# Patient Record
Sex: Male | Born: 1960 | Hispanic: No | Marital: Married | Smoking: Former smoker
Health system: Southern US, Community
[De-identification: ages and names within clinical notes are randomized; demographics above are authoritative.]

## PROBLEM LIST (undated history)

## (undated) ENCOUNTER — Emergency Department: Admission: EM | Payer: BC Managed Care – PPO | Source: Home / Self Care

## (undated) DIAGNOSIS — B1920 Unspecified viral hepatitis C without hepatic coma: Secondary | ICD-10-CM

## (undated) DIAGNOSIS — T7840XA Allergy, unspecified, initial encounter: Secondary | ICD-10-CM

## (undated) HISTORY — DX: Unspecified viral hepatitis C without hepatic coma: B19.20

## (undated) HISTORY — DX: Allergy, unspecified, initial encounter: T78.40XA

---

## 2004-04-23 ENCOUNTER — Ambulatory Visit: Payer: Self-pay | Admitting: Internal Medicine

## 2004-05-13 ENCOUNTER — Ambulatory Visit: Payer: Self-pay | Admitting: Gastroenterology

## 2004-06-13 ENCOUNTER — Ambulatory Visit: Payer: Self-pay | Admitting: Gastroenterology

## 2004-06-27 ENCOUNTER — Ambulatory Visit: Payer: Self-pay | Admitting: Gastroenterology

## 2004-07-11 ENCOUNTER — Ambulatory Visit: Payer: Self-pay | Admitting: Gastroenterology

## 2004-07-25 ENCOUNTER — Ambulatory Visit: Payer: Self-pay | Admitting: Internal Medicine

## 2004-08-22 ENCOUNTER — Ambulatory Visit: Payer: Self-pay | Admitting: Gastroenterology

## 2004-09-26 ENCOUNTER — Ambulatory Visit: Payer: Self-pay | Admitting: Internal Medicine

## 2004-10-24 ENCOUNTER — Ambulatory Visit: Payer: Self-pay | Admitting: Internal Medicine

## 2004-11-21 ENCOUNTER — Ambulatory Visit: Payer: Self-pay | Admitting: Internal Medicine

## 2005-02-13 ENCOUNTER — Ambulatory Visit: Payer: Self-pay | Admitting: Gastroenterology

## 2005-05-08 ENCOUNTER — Ambulatory Visit: Payer: Self-pay | Admitting: Gastroenterology

## 2005-10-30 ENCOUNTER — Ambulatory Visit: Payer: Self-pay | Admitting: Gastroenterology

## 2014-08-10 ENCOUNTER — Telehealth: Payer: Self-pay | Admitting: *Deleted

## 2014-08-10 ENCOUNTER — Ambulatory Visit (INDEPENDENT_AMBULATORY_CARE_PROVIDER_SITE_OTHER): Payer: Worker's Compensation | Admitting: Emergency Medicine

## 2014-08-10 ENCOUNTER — Ambulatory Visit: Payer: Self-pay

## 2014-08-10 VITALS — BP 132/82 | HR 107 | Temp 98.2°F | Resp 18 | Ht 72.0 in | Wt 268.0 lb

## 2014-08-10 DIAGNOSIS — S6710XA Crushing injury of unspecified finger(s), initial encounter: Secondary | ICD-10-CM

## 2014-08-10 DIAGNOSIS — S61219A Laceration without foreign body of unspecified finger without damage to nail, initial encounter: Secondary | ICD-10-CM | POA: Diagnosis not present

## 2014-08-10 DIAGNOSIS — S62639A Displaced fracture of distal phalanx of unspecified finger, initial encounter for closed fracture: Secondary | ICD-10-CM | POA: Diagnosis not present

## 2014-08-10 MED ORDER — CEPHALEXIN 500 MG PO CAPS: 500.0000 mg | ORAL_CAPSULE | Freq: Four times a day (QID) | ORAL | Status: DC

## 2014-08-10 MED ORDER — HYDROCODONE-ACETAMINOPHEN 5-325 MG PO TABS: 1.0000 | ORAL_TABLET | Freq: Four times a day (QID) | ORAL | Status: DC | PRN

## 2014-08-10 MED ORDER — TRAMADOL HCL 50 MG PO TABS: 50.0000 mg | ORAL_TABLET | Freq: Three times a day (TID) | ORAL | Status: DC | PRN

## 2014-08-10 NOTE — Progress Notes (Addendum)
[facility] [address], [place] [id number] [phone]  Date:  08/10/2014   Name:  Lucas Morton   DOB:  03/07/1961   MRN:  [id number]  PCP:  No primary care provider on file.    Chief Complaint: No chief complaint on file.   History of Present Illness:  Lucas Morton is a 54 y.o. very pleasant male patient who presents with the following:  Patient caught his finger in a door at work and has a tip avulsion Not current on tetanus Has marked pain in fifth finger No improvement with over the counter medications or other home remedies. Denies other complaint or health concern today.   There are no active problems to display for this patient.   No past medical history on file.  No past surgical history on file.  History  Substance Use Topics  . Smoking status: Not on file  . Smokeless tobacco: Not on file  . Alcohol Use: Not on file    No family history on file.  Not on File  Medication list has been reviewed and updated.  No current outpatient prescriptions on file prior to visit.   No current facility-administered medications on file prior to visit.    Review of Systems:  As per HPI, otherwise negative.    Physical Examination: There were no vitals filed for this visit. There were no vitals filed for this visit. There is no height or weight on file to calculate BMI. Ideal Body Weight:     GEN: WDWN, NAD, Non-toxic, Alert & Oriented x 3 HEENT: Atraumatic, Normocephalic.  Ears and Nose: No external deformity. EXTR: No clubbing/cyanosis/edema NEURO: Normal gait.  PSYCH: Normally interactive. Conversant. Not depressed or anxious appearing.  Calm demeanor.  Right hand:  1.5 cm avulsion tip of fifth finger on ulnar aspect No FB.  NATI   Assessment and Plan: Finger pain Laceration finger TDAP Discussed wound care options to include referral to hand, FTSG, and closure  He elected closure  Signed,  Phillips OdorJeffery Tymier Lindholm,  MD   [facility] reading (PRIMARY) by  Dr. Dareen PianoAnderson.  Small tuft fracture.

## 2014-08-10 NOTE — Progress Notes (Signed)
Verbal consent obtained from patient.  Digital block anesthesia with 5cc 1% lido without epi.  Wound scrubbed with soap and water and rinsed.  Wound closed with #8 5-0 ethilon simple interrupted sutures.  Wound cleansed and dressed.

## 2014-08-10 NOTE — Telephone Encounter (Signed)
Called pt and spoke with wife.  Advised her to tell pt to call the office back regarding his visit today.  Pt needs to come back to office and sign the back of the drug test consent form.

## 2014-08-10 NOTE — Patient Instructions (Addendum)
Laceration Care, Adult A laceration is a cut or lesion that goes through all layers of the skin and into the tissue just beneath the skin. TREATMENT  Some lacerations may not require closure. Some lacerations may not be able to be closed due to an increased risk of infection. It is important to see your caregiver as soon as possible after an injury to minimize the risk of infection and maximize the opportunity for successful closure. If closure is appropriate, pain medicines may be given, if needed. The wound will be cleaned to help prevent infection. Your caregiver will use stitches (sutures), staples, wound glue (adhesive), or skin adhesive strips to repair the laceration. These tools bring the skin edges together to allow for faster healing and a better cosmetic outcome. However, all wounds will heal with a scar. Once the wound has healed, scarring can be minimized by covering the wound with sunscreen during the day for 1 full year. HOME CARE INSTRUCTIONS  For sutures or staples:  Keep the wound clean and dry.  If you were given a bandage (dressing), you should change it at least once a day. Also, change the dressing if it becomes wet or dirty, or as directed by your caregiver.  Wash the wound with soap and water 2 times a day. Rinse the wound off with water to remove all soap. Pat the wound dry with a clean towel.  After cleaning, apply a thin layer of the antibiotic ointment as recommended by your caregiver. This will help prevent infection and keep the dressing from sticking.  You may shower as usual after the first 24 hours. Do not soak the wound in water until the sutures are removed.  Only take over-the-counter or prescription medicines for pain, discomfort, or fever as directed by your caregiver.  Get your sutures or staples removed as directed by your caregiver. For skin adhesive strips:  Keep the wound clean and dry.  Do not get the skin adhesive strips wet. You may bathe  carefully, using caution to keep the wound dry.  If the wound gets wet, pat it dry with a clean towel.  Skin adhesive strips will fall off on their own. You may trim the strips as the wound heals. Do not remove skin adhesive strips that are still stuck to the wound. They will fall off in time. For wound adhesive:  You may briefly wet your wound in the shower or bath. Do not soak or scrub the wound. Do not swim. Avoid periods of heavy perspiration until the skin adhesive has fallen off on its own. After showering or bathing, gently pat the wound dry with a clean towel.  Do not apply liquid medicine, cream medicine, or ointment medicine to your wound while the skin adhesive is in place. This may loosen the film before your wound is healed.  If a dressing is placed over the wound, be careful not to apply tape directly over the skin adhesive. This may cause the adhesive to be pulled off before the wound is healed.  Avoid prolonged exposure to sunlight or tanning lamps while the skin adhesive is in place. Exposure to ultraviolet light in the first year will darken the scar.  The skin adhesive will usually remain in place for 5 to 10 days, then naturally fall off the skin. Do not pick at the adhesive film. You may need a tetanus shot if:  You cannot remember when you had your last tetanus shot.  You have never had a tetanus  shot. If you get a tetanus shot, your arm may swell, get red, and feel warm to the touch. This is common and not a problem. If you need a tetanus shot and you choose not to have one, there is a rare chance of getting tetanus. Sickness from tetanus can be serious. SEEK MEDICAL CARE IF:   You have redness, swelling, or increasing pain in the wound.  You see a red line that goes away from the wound.  You have yellowish-white fluid (pus) coming from the wound.  You have a fever.  You notice a bad smell coming from the wound or dressing.  Your wound breaks open before or  after sutures have been removed.  You notice something coming out of the wound such as wood or glass.  Your wound is on your hand or foot and you cannot move a finger or toe. SEEK IMMEDIATE MEDICAL CARE IF:   Your pain is not controlled with prescribed medicine.  You have severe swelling around the wound causing pain and numbness or a change in color in your arm, hand, leg, or foot.  Your wound splits open and starts bleeding.  You have worsening numbness, weakness, or loss of function of any joint around or beyond the wound.  You develop painful lumps near the wound or on the skin anywhere on your body. MAKE SURE YOU:   Understand these instructions.  Will watch your condition.  Will get help right away if you are not doing well or get worse. Document Released: 05/12/2005 Document Revised: 08/04/2011 Document Reviewed: 11/05/2010 [facility] Patient Information 2015 [place], [place]. This information is not intended to replace advice given to you by your health care provider. Make sure you discuss any questions you have with your health care provider.   Vaccine (Tetanus, Diphtheria, Pertussis): What You Need to Know 1. Why get vaccinated? Tetanus, diphtheria and pertussis can be very serious diseases, even for adolescents and adults. Tdap vaccine can protect [place] from these diseases. TETANUS (Lockjaw) causes painful muscle tightening and stiffness, usually all over the body.  It can lead to tightening of muscles in the head and neck so you can't open your mouth, swallow, or sometimes even breathe. Tetanus kills about 1 out of 5 people who are infected. DIPHTHERIA can cause a thick coating to form in the back of the throat.  It can lead to breathing problems, paralysis, heart failure, and death. PERTUSSIS (Whooping Cough) causes severe coughing spells, which can cause difficulty breathing, vomiting and disturbed sleep.  It can also lead to weight loss, incontinence, and rib fractures.  Up to 2 in 100 adolescents and 5 in 100 adults with pertussis are hospitalized or have complications, which could include pneumonia or death. These diseases are caused by bacteria. Diphtheria and pertussis are spread from person to person through coughing or sneezing. Tetanus enters the body through cuts, scratches, or wounds. Before vaccines, the [place] saw as many as 200,000 cases a year of diphtheria and pertussis, and hundreds of cases of tetanus. Since vaccination began, tetanus and diphtheria have dropped by about 99% and pertussis by about 80%. 2. Tdap vaccine Tdap vaccine can protect adolescents and adults from tetanus, diphtheria, and pertussis. One dose of Tdap is routinely given at age 34 or 27. People who did not get Tdap at that age should get it as soon as possible. Tdap is especially important for health care professionals and anyone having close contact with a baby younger than 12 months. Pregnant  women should get a dose of Tdap during every pregnancy, to protect the newborn from pertussis. Infants are most at risk for severe, life-threatening complications from pertussis. A similar vaccine, called Td, protects from tetanus and diphtheria, but not pertussis. A Td booster should be given every 10 years. Tdap may be given as one of these boosters if you have not already gotten a dose. Tdap may also be given after a severe cut or burn to prevent tetanus infection. Your doctor can give you more information. Tdap may safely be given at the same time as other vaccines. 3. Some people should not get this vaccine  If you ever had a life-threatening allergic reaction after a dose of any tetanus, diphtheria, or pertussis containing vaccine, OR if you have a severe allergy to any part of this vaccine, you should not get Tdap. Tell your doctor if you have any severe allergies.  If you had a coma, or long or multiple seizures within 7 days after a childhood dose of DTP or DTaP, you should not  get Tdap, unless a cause other than the vaccine was found. You can still get Td.  Talk to your doctor if you:  have epilepsy or another nervous system problem,  had severe pain or swelling after any vaccine containing diphtheria, tetanus or pertussis,  ever had Guillain-Barr Syndrome (GBS),  aren't feeling well on the day the shot is scheduled. 4. Risks of a vaccine reaction With any medicine, including vaccines, there is a chance of side effects. These are usually mild and go away on their own, but serious reactions are also possible. Brief fainting spells can follow a vaccination, leading to injuries from falling. Sitting or lying down for about 15 minutes can help prevent these. Tell your doctor if you feel dizzy or light-headed, or have vision changes or ringing in the ears. Mild problems following Tdap (Did not interfere with activities)  Pain where the shot was given (about 3 in 4 adolescents or 2 in 3 adults)  Redness or swelling where the shot was given (about 1 person in 5)  Mild fever of at least 100.28F (up to about 1 in 25 adolescents or 1 in 100 adults)  Headache (about 3 or 4 people in 10)  Tiredness (about 1 person in 3 or 4)  Nausea, vomiting, diarrhea, stomach ache (up to 1 in 4 adolescents or 1 in 10 adults)  Chills, body aches, sore joints, rash, swollen glands (uncommon) Moderate problems following Tdap (Interfered with activities, but did not require medical attention)  Pain where the shot was given (about 1 in 5 adolescents or 1 in 100 adults)  Redness or swelling where the shot was given (up to about 1 in 16 adolescents or 1 in 25 adults)  Fever over 102F (about 1 in 100 adolescents or 1 in 250 adults)  Headache (about 3 in 20 adolescents or 1 in 10 adults)  Nausea, vomiting, diarrhea, stomach ache (up to 1 or 3 people in 100)  Swelling of the entire arm where the shot was given (up to about 3 in 100). Severe problems following Tdap (Unable to  perform usual activities; required medical attention)  Swelling, severe pain, bleeding and redness in the arm where the shot was given (rare). A severe allergic reaction could occur after any vaccine (estimated less than 1 in a million doses). 5. What if there is a serious reaction? What should I look for?  Look for anything that concerns you, such as signs  of a severe allergic reaction, very high fever, or behavior changes. Signs of a severe allergic reaction can include hives, swelling of the face and throat, difficulty breathing, a fast heartbeat, dizziness, and weakness. These would start a few minutes to a few hours after the vaccination. What should I do?  If you think it is a severe allergic reaction or other emergency that can't wait, call 9-1-1 or get the person to the nearest hospital. Otherwise, call your doctor.  Afterward, the reaction should be reported to the "Vaccine Adverse Event Reporting System" (VAERS). Your doctor might file this report, or you can do it yourself through the VAERS web site at www.vaers.LAgents.no, or by calling 1-845-481-6302. VAERS is only for reporting reactions. They do not give medical advice.  6. The National Vaccine Injury Compensation Program The [employer] Vaccine Injury Compensation Program (VICP) is a federal program that was created to compensate people who may have been injured by certain vaccines. Persons who believe they may have been injured by a vaccine can learn about the program and about filing a claim by calling 1-(225) 534-0191 or visiting the VICP website at SpiritualWord.at. 7. How can I learn more?  Ask your doctor.  Call your local or state health department.  Contact the Centers for Disease Control and Prevention (CDC):  Call (870)433-5817 or visit CDC's website at PicCapture.uy. CDC Tdap Vaccine VIS (10/02/11) Document Released: 11/11/2011 Document Revised: 09/26/2013 Document Reviewed: 08/24/2013 [employer]  Patient Information 2015 [place], [place]. This information is not intended to replace advice given to you by your health care provider. Make sure you discuss any questions you have with your health care provider.

## 2014-08-17 ENCOUNTER — Ambulatory Visit (INDEPENDENT_AMBULATORY_CARE_PROVIDER_SITE_OTHER): Payer: Worker's Compensation | Admitting: Physician Assistant

## 2014-08-17 VITALS — BP 138/80 | HR 77 | Temp 98.3°F | Resp 18 | Ht 71.0 in | Wt 267.6 lb

## 2014-08-17 DIAGNOSIS — S6710XD Crushing injury of unspecified finger(s), subsequent encounter: Secondary | ICD-10-CM | POA: Diagnosis not present

## 2014-08-17 DIAGNOSIS — S62639D Displaced fracture of distal phalanx of unspecified finger, subsequent encounter for fracture with routine healing: Secondary | ICD-10-CM

## 2014-08-17 DIAGNOSIS — Z5189 Encounter for other specified aftercare: Secondary | ICD-10-CM

## 2014-08-17 NOTE — Patient Instructions (Signed)
Your wound is healing nicely. We removed all the sutures today.  Please continue to finish your antibiotic course since you did sustain a "tuft" fracture.  Please soak the wound in water/hydrogen peroxide mix once daily for the next couple days.  Please let us know if you start having fever, chills, expanding redness around the wound, or pus draining from the wound.  Since you did fracture the tip of the finger please wear the splint when you are at work and doing things.  Please come back to see us in one week for further check.  You can return to work today with precautions, no lifting, pulling, pushing, grasping with right hand until we recheck the wound in one week. This is to protect the fracture.

## 2014-08-17 NOTE — Progress Notes (Signed)
  Medical screening examination/treatment/procedure(s) were performed by non-physician practitioner and as supervising physician I was immediately available for consultation/collaboration.     

## 2014-08-17 NOTE — Progress Notes (Signed)
   Subjective:    Patient ID: Lucas Morton, male    DOB: 06/02/1960, 54 y.o.   MRN: [id number]  Chief Complaint  Patient presents with  . Follow-up    wound care   There are no active problems to display for this patient.  Prior to Admission medications   Medication Sig Start Date End Date Taking? Authorizing Provider  aspirin 325 MG tablet Take 325 mg by mouth daily.   Yes Historical Provider, MD  cephALEXin (KEFLEX) 500 MG capsule Take 1 capsule (500 mg total) by mouth 4 (four) times daily. 08/10/14  Yes Huey Bienenstockodd M Jhene Westmoreland, PA  Multiple Vitamin (MULTIVITAMIN) tablet Take 1 tablet by mouth daily.   Yes Historical Provider, MD  traMADol (ULTRAM) 50 MG tablet Take 1 tablet (50 mg total) by mouth every 8 (eight) hours as needed. 08/10/14  Yes Huey Bienenstockodd M Malicia Blasdel, PA   Medications, allergies, past medical history, surgical history, family history, social history and problem list reviewed and updated.  HPI  [age] yom returns for wound check.   Denies purulence from the area. States it is healing well. Minimal pain, has taken the tramadol several times which has helped. Denies fever, chills. Has been keeping area protected while at work. Has good range of motion in finger. Has been taking the keflex qid. Has few more days left.   Review of Systems See HPI.     Objective:   Physical Exam  Constitutional: He is oriented to person, place, and time. He appears well-developed and well-nourished.  Non-toxic appearance. He does not have a sickly appearance. He does not appear ill. No distress.  BP 138/80 mmHg  Pulse 77  Temp(Src) 98.3 F (36.8 C) (Oral)  Resp 18  Ht 5\' 11"  (1.803 m)  Wt 267 lb 9.6 oz (121.383 kg)  BMI 37.34 kg/m2  SpO2 96%   Neurological: He is alert and oriented to person, place, and time.  Skin:  Stitches in place on finger. Mild surrounding erythema just proximal to wound. No purulence. Normal rom. Normal strength. Normal sensation. Normal cap refill.   Psychiatric: He has a  normal mood and affect. His speech is normal.      Assessment & Plan:   [age] yom returns for wound check.   Visit for wound check --wound healing well --stitches removed, no signs of infection at this time --soak in water/H2O2 next couple days --rtc with fever, chills, expanding redness, pus --good rom --keeping dressing in place when out of house, when home resting remove dressing  Closed fracture of tuft of distal phalanx of finger, with routine healing, subsequent encounter --finish abx course --DIP splint applied for protection, wear while at work and active --return to work with precautions --> no right hand use for gripping, lifting, pushing, pulling --rtc one week for recheck  Donnajean Lopesodd M. Khaliah Barnick, PA-C Physician Assistant-Certified [facility]  08/17/2014 1:39 PM

## 2014-08-24 ENCOUNTER — Encounter: Payer: Self-pay | Admitting: Physician Assistant

## 2014-08-24 ENCOUNTER — Ambulatory Visit (INDEPENDENT_AMBULATORY_CARE_PROVIDER_SITE_OTHER): Payer: Worker's Compensation | Admitting: Physician Assistant

## 2014-08-24 VITALS — BP 134/82 | HR 86 | Temp 98.6°F | Resp 16 | Ht 71.0 in | Wt 266.0 lb

## 2014-08-24 DIAGNOSIS — Z5189 Encounter for other specified aftercare: Secondary | ICD-10-CM

## 2014-08-24 DIAGNOSIS — S62639D Displaced fracture of distal phalanx of unspecified finger, subsequent encounter for fracture with routine healing: Secondary | ICD-10-CM

## 2014-08-24 NOTE — Patient Instructions (Signed)
Your wound is healing well.  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 This should be for the next 2 weeks from today. This is to continue to protect the Tuft fracture at the tip of the finger.  Afterward you are free to perform activities without the splint.  Please come back to see us if you are having redness around the area, pus draining, or fevers or chills.  No further follow up with us needed.

## 2014-08-24 NOTE — Progress Notes (Signed)
   Subjective:    Patient ID: Lucas Morton, male    DOB: 02/05/1961, 54 y.o.   MRN: [id number]  Chief Complaint  Patient presents with  . Follow-up   There are no active problems to display for this patient.  Prior to Admission medications   Medication Sig Start Date End Date Taking? Authorizing Provider  aspirin 325 MG tablet Take 325 mg by mouth daily.   Yes Historical Provider, MD  Multiple Vitamin (MULTIVITAMIN) tablet Take 1 tablet by mouth daily.   Yes Historical Provider, MD  traMADol (ULTRAM) 50 MG tablet Take 1 tablet (50 mg total) by mouth every 8 (eight) hours as needed. 08/10/14  Yes Huey Bienenstockodd M Ahnya Akre, PA   Medications, allergies, past medical history, surgical history, family history, social history and problem list reviewed and updated.  HPI  [age] yom returns for wound care.   Site is healing well. Erythema has regressed. Denies fevers, chills. Has been wearing fold over splint while working, doing light tasks with the right hand with no complaints. He has finished his abx course.   Review of Systems No HPI.     Objective:   Physical Exam  Constitutional: He appears well-developed and well-nourished.  Non-toxic appearance. He does not have a sickly appearance. He does not appear ill. No distress.  BP 134/82 mmHg  Pulse 86  Temp(Src) 98.6 F (37 C) (Oral)  Resp 16  Ht 5\' 11"  (1.803 m)  Wt 266 lb (120.657 kg)  BMI 37.12 kg/m2  SpO2 98%   Musculoskeletal:  Right pinky injury healing well. Sutures were removed last week. Incision has held intact. No purulence. Very mild surrounding erythema just distal to wound, this is improved from last week. Full rom with pinky. Normal sensation. Normal strength. Normal cap refill.       Assessment & Plan:   [age] yom returns for wound care.   Closed fracture of tuft of distal phalanx of finger, with routine healing, subsequent encounter Visit for wound check --healing well --ok to return to work with fold over splint use for  next 2 weeks, no restrictions after that time period --no further f/u with us --rtc with pus, expanding redness, fevers, chills  Donnajean Lopesodd M. Raymund Manrique, PA-C Physician Assistant-Certified [facility]  08/24/2014 1:08 PM

## 2018-05-26 DIAGNOSIS — K7290 Hepatic failure, unspecified without coma: Secondary | ICD-10-CM

## 2018-05-26 HISTORY — DX: Hepatic failure, unspecified without coma: K72.90

## 2019-05-27 DIAGNOSIS — R7303 Prediabetes: Secondary | ICD-10-CM

## 2019-05-27 HISTORY — DX: Prediabetes: R73.03

## 2019-09-09 ENCOUNTER — Ambulatory Visit: Payer: Self-pay | Admitting: Registered Nurse

## 2020-05-26 DIAGNOSIS — J189 Pneumonia, unspecified organism: Secondary | ICD-10-CM

## 2020-05-26 HISTORY — DX: Pneumonia, unspecified organism: J18.9

## 2020-07-09 ENCOUNTER — Ambulatory Visit
Admission: EM | Admit: 2020-07-09 | Discharge: 2020-07-09 | Disposition: A | Discharge status: Home / Self Care | Payer: BC Managed Care – PPO

## 2020-07-09 ENCOUNTER — Other Ambulatory Visit: Payer: Self-pay

## 2020-07-09 DIAGNOSIS — R5383 Other fatigue: Secondary | ICD-10-CM | POA: Diagnosis not present

## 2020-07-09 DIAGNOSIS — R251 Tremor, unspecified: Secondary | ICD-10-CM

## 2020-07-09 NOTE — Discharge Instructions (Signed)
I believe that you are still having symptoms from what was most likely COVID. Lets give this another week and see how it goes.  Rest, drink fluids, take your vitamins, go for a walk.  Come back next week if symptoms do not improve.

## 2020-07-09 NOTE — ED Triage Notes (Signed)
Patient presents to Urgent Care with complaints of fatigue, chills, and some tremors since 1/26. Pt states he tested negative for Covid.   Denies fever, abdominal pain, n/v, or diarrhea.

## 2020-07-09 NOTE — ED Provider Notes (Signed)
Renaldo Fiddler    CSN: [id number] Arrival date & time: 07/09/20  0804      History   Chief Complaint Chief Complaint  Patient presents with  . Fatigue    HPI Lucas Morton is a 60 y.o. male.   Pt is a 60 year old male that is otherwise healthy that presents with fatigue, tremors, anxiety. This has been for the past 2 weeks or so. Waxing and waning. Believes that he may have had Covid at the end of January. He had 3 -4 days of extreme fatigue, couldn't get out of bed, respiratory symptoms. Lost 20 pounds. After that his appetite returned and energy was better. Still has lingering sypmptoms and is concerned. He is eating, sleeping and drinking well at this time. Went to work today and they were concerned due to tremors and other symptoms. Has trouble focusing at times. Works on [employer].  Denies any headaches, dizziness, blurred vision, chest pain, shortness of breath, fevers.  Mildly tachycardic today but reports he is feeling pretty anxious. No alcohol, drug use.      History reviewed. No pertinent past medical history.  There are no problems to display for this patient.   History reviewed. No pertinent surgical history.     Home Medications    Prior to Admission medications   Medication Sig Start Date End Date Taking? Authorizing Provider  aspirin 325 MG tablet Take 325 mg by mouth daily.    [provider]  Multiple Vitamin (MULTIVITAMIN) tablet Take 1 tablet by mouth daily.    [provider]  traMADol (ULTRAM) 50 MG tablet Take 1 tablet (50 mg total) by mouth every 8 (eight) hours as needed. 08/10/14   Raelyn Ensign, PA    Family History History reviewed. No pertinent family history.  Social History Social History   Tobacco Use  . Smoking status: Never Smoker  . Smokeless tobacco: Never Used  Substance Use Topics  . Alcohol use: No    Alcohol/week: 0.0 standard drinks  . Drug use: No     Allergies   Tylenol  [acetaminophen]   Review of Systems Review of Systems   Physical Exam Triage Vital Signs ED Triage Vitals  Enc Vitals Group     BP --      Pulse Rate 07/09/20 0815 (!) 125     Resp 07/09/20 0815 18     Temp 07/09/20 0815 98.6 F (37 C)     Temp Source 07/09/20 0815 Oral     SpO2 07/09/20 0815 95 %     Weight --      Height --      Head Circumference --      Peak Flow --      Pain Score 07/09/20 0820 0     Pain Loc --      Pain Edu? --      Excl. in GC? --    No data found.  Updated Vital Signs BP (!) 155/92 (BP Location: Left Arm)   Pulse (!) 125   Temp 98.6 F (37 C) (Oral)   Resp 18   SpO2 95%   Visual Acuity Right Eye Distance:   Left Eye Distance:   Bilateral Distance:    Right Eye Near:   Left Eye Near:    Bilateral Near:     Physical Exam Constitutional:      General: He is not in acute distress.    Appearance: Normal appearance. He is not ill-appearing, toxic-appearing or  diaphoretic.  HENT:     Nose: Nose normal.  Eyes:     Conjunctiva/sclera: Conjunctivae normal.  Cardiovascular:     Rate and Rhythm: Tachycardia present.     Pulses: Normal pulses.     Heart sounds: Normal heart sounds.  Pulmonary:     Effort: Pulmonary effort is normal.     Breath sounds: Normal breath sounds.  Musculoskeletal:        General: Normal range of motion.  Skin:    General: Skin is warm and dry.  Neurological:     General: No focal deficit present.     Mental Status: He is alert.     Motor: Tremor present.  Psychiatric:        Mood and Affect: Mood normal.      UC Treatments / Results  Labs (all labs ordered are listed, but only abnormal results are displayed) Labs Reviewed - No data to display  EKG   Radiology No results found.  Procedures Procedures (including critical care time)  Medications Ordered in UC Medications - No data to display  Initial Impression / Assessment and Plan / UC Course  I have reviewed the triage vital signs and  the nursing notes.  Pertinent labs & imaging results that were available during my care of the patient were reviewed by me and considered in my medical decision making (see chart for details).     Fatigue and tremor  Unsure of cause at this time but could be post covid symptoms.  Based on pt hx may have had covid a few weeks ago. Feeling better but having residual tremor and fatigue. Pt very anxious.  Has been checking vitals at home which hear rate ranges in the 90s and oxygen has been 97%.  Mild tremor on exam today.  Otherwise no concerns on exam.  Heart rate around 98 but regular on auscultation.  Offered patient labs and work-up but patient would like to wait for another week or so to see if symptoms resolve or get better Recommended to continue staying hydrated, resting, eating healthy meals and drinking fluids Follow up as needed for continued or worsening symptoms  Final Clinical Impressions(s) / UC Diagnoses   Final diagnoses:  Fatigue, unspecified type  Tremor     Discharge Instructions     I believe that you are still having symptoms from what was most likely COVID. Lets give this another week and see how it goes.  Rest, drink fluids, take your vitamins, go for a walk.  Come back next week if symptoms do not improve.       ED Prescriptions    None     PDMP not reviewed this encounter.   Janace Aris, NP 07/09/20 1524

## 2020-07-16 ENCOUNTER — Ambulatory Visit
Admission: EM | Admit: 2020-07-16 | Discharge: 2020-07-16 | Disposition: A | Discharge status: Home / Self Care | Payer: BC Managed Care – PPO | Attending: Family Medicine | Admitting: Family Medicine

## 2020-07-16 ENCOUNTER — Other Ambulatory Visit: Payer: Self-pay

## 2020-07-16 ENCOUNTER — Ambulatory Visit (INDEPENDENT_AMBULATORY_CARE_PROVIDER_SITE_OTHER): Payer: BC Managed Care – PPO

## 2020-07-16 DIAGNOSIS — R5383 Other fatigue: Secondary | ICD-10-CM | POA: Diagnosis present

## 2020-07-16 DIAGNOSIS — J189 Pneumonia, unspecified organism: Secondary | ICD-10-CM | POA: Insufficient documentation

## 2020-07-16 MED ORDER — AMOXICILLIN-POT CLAVULANATE 875-125 MG PO TABS: 1.0000 | ORAL_TABLET | Freq: Two times a day (BID) | ORAL | 0 refills | Status: DC

## 2020-07-16 NOTE — ED Triage Notes (Signed)
Pt c/o fatigue with minimal exertion, weakness, new tremor to hands, accelerated heart rate for past week. Reports congestion that is improving.  Denies CP, SOB, extremity weakness, pain to back, jaw, arm, n/v, diaphoresis. S1S2 RRR

## 2020-07-16 NOTE — ED Provider Notes (Addendum)
Renaldo Fiddler    CSN: [id number] Arrival date & time: 07/16/20  0801      History   Chief Complaint Chief Complaint  Patient presents with  . Tachycardia  . Fatigue    HPI Jamarius Saha is a 60 y.o. male.   Pt is a 60 year old male that presents with continued fatigue, SOB, tremors, tachycardia. This has been for the past few weeks. Was seen here a week ago and did not want a work up at that time. We thought symptoms most likely from covid. He is here today with wanting a work up done. Symptoms have remained the same and not worsened.      History reviewed. No pertinent past medical history.  There are no problems to display for this patient.   History reviewed. No pertinent surgical history.     Home Medications    Prior to Admission medications   Medication Sig Start Date End Date Taking? Authorizing Provider  amoxicillin-clavulanate (AUGMENTIN) 875-125 MG tablet Take 1 tablet by mouth every 12 (twelve) hours. 07/16/20  Yes Jowana Thumma A, NP  aspirin 325 MG tablet Take 325 mg by mouth daily.   Yes [provider]  traMADol (ULTRAM) 50 MG tablet Take 1 tablet (50 mg total) by mouth every 8 (eight) hours as needed. 08/10/14  Yes McVeigh, Todd, PA  Multiple Vitamin (MULTIVITAMIN) tablet Take 1 tablet by mouth daily.    [provider]    Family History History reviewed. No pertinent family history.  Social History Social History   Tobacco Use  . Smoking status: Never Smoker  . Smokeless tobacco: Never Used  Substance Use Topics  . Alcohol use: No    Alcohol/week: 0.0 standard drinks  . Drug use: No     Allergies   Tylenol [acetaminophen]   Review of Systems Review of Systems   Physical Exam Triage Vital Signs ED Triage Vitals  Enc Vitals Group     BP 07/16/20 0813 (!) 158/98     Pulse Rate 07/16/20 0813 (!) 116     Resp 07/16/20 0813 18     Temp 07/16/20 0813 99 F (37.2 C)     Temp Source 07/16/20 0813 Oral      SpO2 07/16/20 0813 95 %     Weight --      Height --      Head Circumference --      Peak Flow --      Pain Score 07/16/20 0816 0     Pain Loc --      Pain Edu? --      Excl. in GC? --    No data found.  Updated Vital Signs BP (!) 158/98 (BP Location: Left Arm)   Pulse (!) 116   Temp 99 F (37.2 C) (Oral)   Resp 18   SpO2 95%   Visual Acuity Right Eye Distance:   Left Eye Distance:   Bilateral Distance:    Right Eye Near:   Left Eye Near:    Bilateral Near:     Physical Exam Vitals and nursing note reviewed.  Constitutional:      General: He is not in acute distress.    Appearance: Normal appearance. He is not ill-appearing, toxic-appearing or diaphoretic.  HENT:     Head: Normocephalic and atraumatic.  Eyes:     Conjunctiva/sclera: Conjunctivae normal.  Pulmonary:     Effort: Pulmonary effort is normal.  Musculoskeletal:  General: Normal range of motion.     Cervical back: Normal range of motion.  Skin:    General: Skin is warm and dry.  Neurological:     Mental Status: He is alert.  Psychiatric:        Mood and Affect: Mood normal.      UC Treatments / Results  Labs (all labs ordered are listed, but only abnormal results are displayed) Labs Reviewed  NOVEL CORONAVIRUS, NAA  CBC WITH DIFFERENTIAL/PLATELET  COMPREHENSIVE METABOLIC PANEL  TSH  POCT URINALYSIS DIP (MANUAL ENTRY)    EKG   Radiology DG Chest 2 View  Result Date: 07/16/2020 CLINICAL DATA:  Fatigue, shortness of breath. EXAM: CHEST - 2 VIEW COMPARISON:  None. FINDINGS: The heart size and mediastinal contours are within normal limits. No pneumothorax or pleural effusion is noted. Patchy airspace opacities are noted in both lungs consistent with multifocal pneumonia. The visualized skeletal structures are unremarkable. IMPRESSION: Multifocal pneumonia. Electronically Signed   By: Lupita Raider M.D.   On: 07/16/2020 08:55    Procedures Procedures (including critical care  time)  Medications Ordered in UC Medications - No data to display  Initial Impression / Assessment and Plan / UC Course  I have reviewed the triage vital signs and the nursing notes.  Pertinent labs & imaging results that were available during my care of the patient were reviewed by me and considered in my medical decision making (see chart for details).     Multifocal pneumonia This is likely the cause of his symptoms.  Most likely Covid pneumonia. His symptoms have been persistent over the past few weeks. Still having low grade temp.  EKG normal sinus rhythm and mild tachycardia. No concern for ACS.  I am opting to go ahead and cover with abx at this time incase secondary bacterial infection. Treating with Augmentin.  Recommended continue to rest, hydrate, eat healthy meals and take vitamins.  I am checking some basic labs.   Final Clinical Impressions(s) / UC Diagnoses   Final diagnoses:  Fatigue, unspecified type  Multifocal pneumonia     Discharge Instructions     Your x ray showed multifocal pneumonia which is likely from Covid.  Since its been a few weeks we can go ahead and trial a round of antibiotics to see if this helps in case its bacterial Make sure you rest, hydrate and take vitamins to boost the immune system.  I will call you with any abnormalities of your blood work.      ED Prescriptions    Medication Sig Dispense Auth. Provider   amoxicillin-clavulanate (AUGMENTIN) 875-125 MG tablet Take 1 tablet by mouth every 12 (twelve) hours. 14 tablet Nabeeha Badertscher A, NP     PDMP not reviewed this encounter.       Janace Aris, NP 07/16/20 [phone]

## 2020-07-16 NOTE — Discharge Instructions (Addendum)
Your x ray showed multifocal pneumonia which is likely from Covid.  Since its been a few weeks we can go ahead and trial a round of antibiotics to see if this helps in case its bacterial Make sure you rest, hydrate and take vitamins to boost the immune system.  I will call you with any abnormalities of your blood work.

## 2020-07-17 LAB — NOVEL CORONAVIRUS, NAA: SARS-CoV-2, NAA: NOT DETECTED

## 2020-07-17 LAB — COMPREHENSIVE METABOLIC PANEL
ALT: 35 IU/L (ref 0–44)
AST: 17 IU/L (ref 0–40)
Albumin/Globulin Ratio: 1.2 (ref 1.2–2.2)
Albumin: 4 g/dL (ref 3.8–4.8)
Alkaline Phosphatase: 82 IU/L (ref 44–121)
BUN/Creatinine Ratio: 18 (ref 10–24)
BUN: 13 mg/dL (ref 8–27)
Bilirubin Total: 0.4 mg/dL (ref 0.0–1.2)
CO2: 20 mmol/L (ref 20–29)
Calcium: 9.3 mg/dL (ref 8.6–10.2)
Chloride: 105 mmol/L (ref 96–106)
Creatinine, Ser: 0.72 mg/dL — ABNORMAL LOW (ref 0.76–1.27)
GFR calc Af Amer: 116 mL/min/{1.73_m2} (ref >59)
GFR calc non Af Amer: 101 mL/min/{1.73_m2} (ref >59)
Globulin, Total: 3.4 g/dL (ref 1.5–4.5)
Glucose: 115 mg/dL — ABNORMAL HIGH (ref 65–99)
Potassium: 3.9 mmol/L (ref 3.5–5.2)
Sodium: 142 mmol/L (ref 134–144)
Total Protein: 7.4 g/dL (ref 6.0–8.5)

## 2020-07-17 LAB — SARS-COV-2, NAA 2 DAY TAT

## 2020-07-17 LAB — CBC WITH DIFFERENTIAL/PLATELET
Basophils Absolute: 0 10*3/uL (ref 0.0–0.2)
Basos: 0 %
EOS (ABSOLUTE): 0.2 10*3/uL (ref 0.0–0.4)
Eos: 2 %
Hematocrit: 45.9 % (ref 37.5–51.0)
Hemoglobin: 15.7 g/dL (ref 13.0–17.7)
Immature Grans (Abs): 0 10*3/uL (ref 0.0–0.1)
Immature Granulocytes: 0 %
Lymphocytes Absolute: 1.8 10*3/uL (ref 0.7–3.1)
Lymphs: 20 %
MCH: 29.7 pg (ref 26.6–33.0)
MCHC: 34.2 g/dL (ref 31.5–35.7)
MCV: 87 fL (ref 79–97)
Monocytes Absolute: 1 10*3/uL — ABNORMAL HIGH (ref 0.1–0.9)
Monocytes: 11 %
Neutrophils Absolute: 5.9 10*3/uL (ref 1.4–7.0)
Neutrophils: 67 %
Platelets: 232 10*3/uL (ref 150–450)
RBC: 5.28 x10E6/uL (ref 4.14–5.80)
RDW: 14.1 % (ref 11.6–15.4)
WBC: 8.9 10*3/uL (ref 3.4–10.8)

## 2020-07-17 LAB — TSH: TSH: 0.608 u[IU]/mL (ref 0.450–4.500)

## 2020-07-19 ENCOUNTER — Ambulatory Visit
Admission: EM | Admit: 2020-07-19 | Discharge: 2020-07-19 | Disposition: A | Discharge status: Home / Self Care | Payer: BC Managed Care – PPO | Attending: Emergency Medicine | Admitting: Emergency Medicine

## 2020-07-19 ENCOUNTER — Other Ambulatory Visit: Payer: Self-pay

## 2020-07-19 DIAGNOSIS — J189 Pneumonia, unspecified organism: Secondary | ICD-10-CM | POA: Diagnosis not present

## 2020-07-19 DIAGNOSIS — Z20822 Contact with and (suspected) exposure to covid-19: Secondary | ICD-10-CM

## 2020-07-19 DIAGNOSIS — Z09 Encounter for follow-up examination after completed treatment for conditions other than malignant neoplasm: Secondary | ICD-10-CM

## 2020-07-19 MED ORDER — BENZONATATE 200 MG PO CAPS: 200.0000 mg | ORAL_CAPSULE | Freq: Three times a day (TID) | ORAL | 0 refills | Status: AC | PRN

## 2020-07-19 MED ORDER — AZITHROMYCIN 250 MG PO TABS: 250.0000 mg | ORAL_TABLET | Freq: Every day | ORAL | 0 refills | Status: DC

## 2020-07-19 MED ORDER — PREDNISONE 50 MG PO TABS: 50.0000 mg | ORAL_TABLET | Freq: Every day | ORAL | 0 refills | Status: AC

## 2020-07-19 NOTE — Discharge Instructions (Signed)
Complete course of Augmentin Add in azithromycin Prednisone daily for 5 days-take with food and earlier in the day if possible Tessalon for cough Continue to rest and drink plenty of fluids Follow-up if not improving or worsening You may return to work once you have consistent improvement for 3 to 4 days as well as heart rate improving below 100

## 2020-07-19 NOTE — ED Triage Notes (Signed)
Patient presents to Urgent Care for follow-up for 02/21 visit. Patient reports he was dx with pneumonia and needs a work note to return to work.

## 2020-07-19 NOTE — ED Provider Notes (Signed)
[facility]    CSN: [id number] Arrival date & time: 07/19/20  1146      History   Chief Complaint Chief Complaint  Patient presents with  . Follow-up    HPI Lucas Morton is a 60 y.o. male presenting today for follow-up of pneumonia.  Patient was initially seen at urgent care on 2/14 for fatigue and feeling shaky, suspected that he likely was recovering for Covid as he had developed mild URI symptoms fatigue and body aches for the last week in January.  His symptoms persisted for the following week and refollowed up this past Monday on 2/21.  Had chest x-ray showing multifocal pneumonia, had blood work with slightly elevated glucose, but otherwise unremarkable, white count normal, Covid test negative at this time.  He was started on Augmentin due to length of symptoms.  He is presenting today as he is concerned on when he can return to work.  He does report that he has felt slightly improved, but continues to report fatigue, shaking and occasional cough with elevated heart rate.  On chart review at initial visit on 2/14 heart rate was 125, this past Monday heart rate was 116, today heart rate 110.  He denies any leg pain or leg swelling.  Denies history of prior DVT/PE.  Denies recent travel immobilization or surgery.  Denies tobacco use.  HPI  History reviewed. No pertinent past medical history.  There are no problems to display for this patient.   History reviewed. No pertinent surgical history.     Home Medications    Prior to Admission medications   Medication Sig Start Date End Date Taking? Authorizing Provider  azithromycin (ZITHROMAX) 250 MG tablet Take 1 tablet (250 mg total) by mouth daily. Take first 2 tablets together, then 1 every day until finished. 07/19/20  Yes Rosalynn Sergent C, PA-C  benzonatate (TESSALON) 200 MG capsule Take 1 capsule (200 mg total) by mouth 3 (three) times daily as needed for up to 7 days for cough. 07/19/20 07/26/20 Yes Scotti Kosta  C, PA-C  predniSONE (DELTASONE) 50 MG tablet Take 1 tablet (50 mg total) by mouth daily with breakfast for 5 days. 07/19/20 07/24/20 Yes Nanette Wirsing C, PA-C  amoxicillin-clavulanate (AUGMENTIN) 875-125 MG tablet Take 1 tablet by mouth every 12 (twelve) hours. 07/16/20   Dahlia Byes A, NP  aspirin 325 MG tablet Take 325 mg by mouth daily.    [provider]  Multiple Vitamin (MULTIVITAMIN) tablet Take 1 tablet by mouth daily.    [provider]  traMADol (ULTRAM) 50 MG tablet Take 1 tablet (50 mg total) by mouth every 8 (eight) hours as needed. 08/10/14   Raelyn Ensign, PA    Family History History reviewed. No pertinent family history.  Social History Social History   Tobacco Use  . Smoking status: Never Smoker  . Smokeless tobacco: Never Used  Substance Use Topics  . Alcohol use: No    Alcohol/week: 0.0 standard drinks  . Drug use: No     Allergies   Tylenol [acetaminophen]   Review of Systems Review of Systems  Constitutional: Positive for fatigue. Negative for activity change, appetite change, chills and fever.  HENT: Positive for congestion. Negative for ear pain, rhinorrhea, sinus pressure, sore throat and trouble swallowing.   Eyes: Negative for discharge and redness.  Respiratory: Positive for cough. Negative for chest tightness and shortness of breath.   Cardiovascular: Negative for chest pain.  Gastrointestinal: Negative for abdominal pain, diarrhea, nausea and  vomiting.  Musculoskeletal: Positive for myalgias.  Skin: Negative for rash.  Neurological: Negative for dizziness, light-headedness and headaches.     Physical Exam Triage Vital Signs ED Triage Vitals  Enc Vitals Group     BP 07/19/20 1210 (!) 149/91     Pulse Rate 07/19/20 1210 (!) 110     Resp 07/19/20 1210 20     Temp 07/19/20 1210 98 F (36.7 C)     Temp Source 07/19/20 1210 Oral     SpO2 07/19/20 1210 95 %     Weight 07/19/20 1213 240 lb (108.9 kg)     Height --      Head  Circumference --      Peak Flow --      Pain Score 07/19/20 1213 0     Pain Loc --      Pain Edu? --      Excl. in GC? --    No data found.  Updated Vital Signs BP (!) 149/91 (BP Location: Right Arm)   Pulse (!) 110   Temp 98 F (36.7 C) (Oral)   Resp 20   Wt 240 lb (108.9 kg)   SpO2 95%   BMI 33.47 kg/m   Visual Acuity Right Eye Distance:   Left Eye Distance:   Bilateral Distance:    Right Eye Near:   Left Eye Near:    Bilateral Near:     Physical Exam Vitals and nursing note reviewed.  Constitutional:      Appearance: He is well-developed and well-nourished.     Comments: No acute distress  HENT:     Head: Normocephalic and atraumatic.     Ears:     Comments: Bilateral ears without tenderness to palpation of external auricle, tragus and mastoid, EAC's without erythema or swelling, TM's with good bony landmarks and cone of light. Non erythematous.     Nose: Nose normal.     Mouth/Throat:     Comments: Oral mucosa pink and moist, no tonsillar enlargement or exudate. Posterior pharynx patent and nonerythematous, no uvula deviation or swelling. Normal phonation. Eyes:     Conjunctiva/sclera: Conjunctivae normal.  Cardiovascular:     Rate and Rhythm: Regular rhythm. Tachycardia present.  Pulmonary:     Effort: Pulmonary effort is normal. No respiratory distress.     Comments: Breathing comfortably at rest, CTABL, no wheezing, rales or other adventitious sounds auscultated Abdominal:     General: There is no distension.  Musculoskeletal:        General: Normal range of motion.     Cervical back: Neck supple.     Comments: Hands with mild tremor at rest  Skin:    General: Skin is warm and dry.  Neurological:     Mental Status: He is alert and oriented to person, place, and time.  Psychiatric:        Mood and Affect: Mood and affect normal.      UC Treatments / Results  Labs (all labs ordered are listed, but only abnormal results are displayed) Labs  Reviewed - No data to display  EKG   Radiology No results found.  Procedures Procedures (including critical care time)  Medications Ordered in UC Medications - No data to display  Initial Impression / Assessment and Plan / UC Course  I have reviewed the triage vital signs and the nursing notes.  Pertinent labs & imaging results that were available during my care of the patient were reviewed by me and considered  in my medical decision making (see chart for details).     Multifocal pneumonia noted on x-ray 3 days ago, negative Covid testing at the time, suspect likely had pneumonia prior to Monday, advised patient to complete course of Augmentin that he is started, adding in azithromycin to cover atypicals and course of prednisone given likely from Covid infection.  He has since tested negative, patient likely no longer contagious.  Recommending patient to return to work after 3 to 4 days of consistent improvement as well as once his heart rate returns to normal range.  Lower suspicion of DVT/PE at this time, but advised of heart rate remaining elevated and continuing to have shortness of breath/fatigue to follow-up in emergency room.  Discussed strict return precautions. Patient verbalized understanding and is agreeable with plan.  Final Clinical Impressions(s) / UC Diagnoses   Final diagnoses:  Multifocal pneumonia  Suspected COVID-19 virus infection  Follow up     Discharge Instructions     Complete course of Augmentin Add in azithromycin Prednisone daily for 5 days-take with food and earlier in the day if possible Tessalon for cough Continue to rest and drink plenty of fluids Follow-up if not improving or worsening You may return to work once you have consistent improvement for 3 to 4 days as well as heart rate improving below 100   ED Prescriptions    Medication Sig Dispense Auth. Provider   azithromycin (ZITHROMAX) 250 MG tablet Take 1 tablet (250 mg total) by mouth  daily. Take first 2 tablets together, then 1 every day until finished. 6 tablet Virat Prather C, PA-C   predniSONE (DELTASONE) 50 MG tablet Take 1 tablet (50 mg total) by mouth daily with breakfast for 5 days. 5 tablet Quantarius Genrich C, PA-C   benzonatate (TESSALON) 200 MG capsule Take 1 capsule (200 mg total) by mouth 3 (three) times daily as needed for up to 7 days for cough. 28 capsule Mateus Rewerts, [place] C, PA-C     PDMP not reviewed this encounter.   Lew Dawes, [place] 07/19/20 1319

## 2020-07-24 ENCOUNTER — Ambulatory Visit
Admission: EM | Admit: 2020-07-24 | Discharge: 2020-07-24 | Disposition: A | Discharge status: Home / Self Care | Payer: BC Managed Care – PPO | Attending: Urgent Care | Admitting: Urgent Care

## 2020-07-24 ENCOUNTER — Other Ambulatory Visit: Payer: Self-pay

## 2020-07-24 DIAGNOSIS — J189 Pneumonia, unspecified organism: Secondary | ICD-10-CM

## 2020-07-24 DIAGNOSIS — R000 Tachycardia, unspecified: Secondary | ICD-10-CM

## 2020-07-24 DIAGNOSIS — U071 COVID-19: Secondary | ICD-10-CM

## 2020-07-24 NOTE — ED Triage Notes (Signed)
Pt states seen here last Wednesday and was told to come back to have heart rate checked. Denies chest pain or SOB. States finished his Augmentin yesterday and still on Zithromax.

## 2020-07-24 NOTE — ED Provider Notes (Signed)
[facility]   MRN: [id number] DOB: [birth year]  Subjective:   Lucas Morton is a 60 y.o. male presenting for follow-up regarding his pulse.  Patient has battled COVID-19 through February.  Initially was recommended supportive care but as patient continued to worsen and was seen again and had a chest x-ray done that showed multifocal pneumonia.  He was subsequently started on Augmentin.  Unfortunately, patient continued to have symptoms and return to clinic here at [facility] urgent care.  He was advised to finish the course of Augmentin and was added prednisone, azithromycin.  He has persistently had tachycardia.  Was advised to return to our clinic for follow-up.  Admits that he has noticed a really good improvement in his symptoms.  Has monitored his heart rate at home and has been in the low 90s.  Denies history of smoking, lung disorders.  Denies history of heart conditions.  Patient is currently in the process of establishing care with a new PCP.  No current facility-administered medications for this encounter.  Current Outpatient Medications:  .  aspirin 325 MG tablet, Take 325 mg by mouth daily., Disp: , Rfl:  .  azithromycin (ZITHROMAX) 250 MG tablet, Take 1 tablet (250 mg total) by mouth daily. Take first 2 tablets together, then 1 every day until finished., Disp: 6 tablet, Rfl: 0 .  benzonatate (TESSALON) 200 MG capsule, Take 1 capsule (200 mg total) by mouth 3 (three) times daily as needed for up to 7 days for cough., Disp: 28 capsule, Rfl: 0 .  Multiple Vitamin (MULTIVITAMIN) tablet, Take 1 tablet by mouth daily., Disp: , Rfl:  .  predniSONE (DELTASONE) 50 MG tablet, Take 1 tablet (50 mg total) by mouth daily with breakfast for 5 days., Disp: 5 tablet, Rfl: 0 .  traMADol (ULTRAM) 50 MG tablet, Take 1 tablet (50 mg total) by mouth every 8 (eight) hours as needed., Disp: 30 tablet, Rfl: 0   Allergies  Allergen Reactions  . Tylenol [Acetaminophen]     History reviewed. No  pertinent past medical history.   History reviewed. No pertinent surgical history.  History reviewed. No pertinent family history.  Social History   Tobacco Use  . Smoking status: Never Smoker  . Smokeless tobacco: Never Used  Substance Use Topics  . Alcohol use: No    Alcohol/week: 0.0 standard drinks  . Drug use: No    ROS   Objective:   Vitals: BP (!) 141/92 (BP Location: Left Arm)   Pulse (!) 102   Temp 98 F (36.7 C) (Oral)   Resp 18   SpO2 96%   Pulse ranged from 102-104bpm on recheck by PA-Lot [place].   Wt Readings from Last 3 Encounters:  07/19/20 240 lb (108.9 kg)  08/24/14 266 lb (120.7 kg)  08/17/14 267 lb 9.6 oz (121.4 kg)   Temp Readings from Last 3 Encounters:  07/24/20 98 F (36.7 C) (Oral)  07/19/20 98 F (36.7 C) (Oral)  07/16/20 99 F (37.2 C) (Oral)   BP Readings from Last 3 Encounters:  07/24/20 (!) 141/92  07/19/20 (!) 149/91  07/16/20 (!) 158/98   Pulse Readings from Last 3 Encounters:  07/24/20 (!) 102  07/19/20 (!) 110  07/16/20 (!) 116   Physical Exam Constitutional:      General: He is not in acute distress.    Appearance: Normal appearance. He is well-developed. He is not ill-appearing, toxic-appearing or diaphoretic.  HENT:     Head: Normocephalic and atraumatic.     Right  Ear: External ear normal.     Left Ear: External ear normal.     Nose: Nose normal.     Mouth/Throat:     Mouth: Mucous membranes are moist.     Pharynx: Oropharynx is clear.  Eyes:     General: No scleral icterus.       Right eye: No discharge.        Left eye: No discharge.     Extraocular Movements: Extraocular movements intact.     Conjunctiva/sclera: Conjunctivae normal.     Pupils: Pupils are equal, round, and reactive to light.  Cardiovascular:     Rate and Rhythm: Regular rhythm. Tachycardia present.     Heart sounds: Normal heart sounds. No murmur heard. No friction rub. No gallop.   Pulmonary:     Effort: Pulmonary effort is normal. No  respiratory distress.     Breath sounds: Normal breath sounds. No stridor. No wheezing, rhonchi or rales.  Skin:    General: Skin is warm and dry.  Neurological:     Mental Status: He is alert and oriented to person, place, and time.  Psychiatric:        Mood and Affect: Mood normal.        Behavior: Behavior normal.        Thought Content: Thought content normal.        Judgment: Judgment normal.    ED ECG REPORT   Date: 07/24/2020  EKG Time: 10:10 AM  Rate: 101 bpm  Rhythm: sinus tachycardia,  unchanged from previous tracings, sinus tachycardia  Axis: Normal  Intervals:none  ST&T Change: Nonspecific T wave flattening in lead aVL  Narrative Interpretation: Sinus tachycardia with nonspecific T wave change, completely unchanged from last EKG.   Assessment and Plan :   PDMP not reviewed this encounter.  1. Multifocal pneumonia   2. Pneumonia due to COVID-19 virus   3. Tachycardia     Suspect patient is recovering from Covid pneumonia.  Recommended to finish out his current treatment including continued use of supportive care.  Repeat chest x-ray in 3 to 4 weeks. Counseled patient on potential for adverse effects with medications prescribed/recommended today, ER and return-to-clinic precautions discussed, patient verbalized understanding.    Wallis Bamberg, PA-C 07/24/20 1108

## 2020-11-15 ENCOUNTER — Encounter (HOSPITAL_COMMUNITY): Payer: Self-pay | Admitting: Emergency Medicine

## 2020-11-15 ENCOUNTER — Other Ambulatory Visit: Payer: Self-pay

## 2020-11-15 ENCOUNTER — Ambulatory Visit
Admission: EM | Admit: 2020-11-15 | Discharge: 2020-11-15 | Disposition: A | Discharge status: Home / Self Care | Payer: BC Managed Care – PPO | Attending: Student | Admitting: Student

## 2020-11-15 ENCOUNTER — Emergency Department (HOSPITAL_COMMUNITY): Payer: BC Managed Care – PPO

## 2020-11-15 ENCOUNTER — Emergency Department (HOSPITAL_COMMUNITY)
Admission: EM | Admit: 2020-11-15 | Discharge: 2020-11-15 | Disposition: A | Discharge status: Home / Self Care | Payer: BC Managed Care – PPO | Attending: Emergency Medicine | Admitting: Emergency Medicine

## 2020-11-15 DIAGNOSIS — R4020 Unspecified coma: Secondary | ICD-10-CM | POA: Diagnosis not present

## 2020-11-15 DIAGNOSIS — Z7982 Long term (current) use of aspirin: Secondary | ICD-10-CM | POA: Diagnosis not present

## 2020-11-15 DIAGNOSIS — R55 Syncope and collapse: Secondary | ICD-10-CM | POA: Insufficient documentation

## 2020-11-15 DIAGNOSIS — R0981 Nasal congestion: Secondary | ICD-10-CM | POA: Diagnosis not present

## 2020-11-15 DIAGNOSIS — Z20822 Contact with and (suspected) exposure to covid-19: Secondary | ICD-10-CM | POA: Insufficient documentation

## 2020-11-15 DIAGNOSIS — B349 Viral infection, unspecified: Secondary | ICD-10-CM

## 2020-11-15 LAB — POCT FASTING CBG KUC MANUAL ENTRY: POCT Glucose (KUC): 138 mg/dL — AB (ref 70–99)

## 2020-11-15 LAB — RESP PANEL BY RT-PCR (FLU A&B, COVID) ARPGX2
Influenza A by PCR: NEGATIVE
Influenza B by PCR: NEGATIVE
SARS Coronavirus 2 by RT PCR: NEGATIVE

## 2020-11-15 NOTE — ED Triage Notes (Signed)
BIBA Per EMS: Pt coming from dr office with Syncopal episode at approx 10:40. Pt complain of sinus pressure.  20 R hand  400 cc fluids given  111/70  116 CBG  95% RA  86 HR  99.8 temp

## 2020-11-15 NOTE — ED Provider Notes (Signed)
[facility]-EMERGENCY DEPT Provider Note   CSN: [id number] Arrival date & time: 11/15/20  1130     History Chief Complaint  Patient presents with   syncopal episode     Lucas Morton is a 60 y.o. male.  60 year old male who presents after being sent from urgent care center due to possible syncopal event.  Patient states that he did not get very much sleep last night.  States he was waiting room for quite some time and fell into deep sleep.  According to clinic notes, they had trouble arousing him.  States that he has not had any recent illnesses other than feeling some sinus pressure with some drainage.  Denies any neck pain.  No photophobia.  No vomiting or diarrhea.  States he has no focal neurological deficits.      History reviewed. No pertinent past medical history.  There are no problems to display for this patient.   History reviewed. No pertinent surgical history.     History reviewed. No pertinent family history.  Social History   Tobacco Use   Smoking status: Never   Smokeless tobacco: Never  Substance Use Topics   Alcohol use: No    Alcohol/week: 0.0 standard drinks   Drug use: No    Home Medications Prior to Admission medications   Medication Sig Start Date End Date Taking? Authorizing Provider  aspirin 325 MG tablet Take 325 mg by mouth daily.    [provider]  azithromycin (ZITHROMAX) 250 MG tablet Take 1 tablet (250 mg total) by mouth daily. Take first 2 tablets together, then 1 every day until finished. 07/19/20   Wieters, Hallie C, PA-C  Multiple Vitamin (MULTIVITAMIN) tablet Take 1 tablet by mouth daily.    [provider]  traMADol (ULTRAM) 50 MG tablet Take 1 tablet (50 mg total) by mouth every 8 (eight) hours as needed. 08/10/14   McVeigh, Tawanna Cooler, PA    Allergies    Tylenol [acetaminophen]  Review of Systems   Review of Systems  All other systems reviewed and are negative.  Physical Exam Updated Vital  Signs BP 135/84 (BP Location: Left Arm)   Pulse 96   Temp (!) 102 F (38.9 C) (Oral) Comment: I made the nurse aware  Resp 16   SpO2 97%   Physical Exam Vitals and nursing note reviewed.  Constitutional:      General: He is not in acute distress.    Appearance: Normal appearance. He is well-developed. He is not toxic-appearing.  HENT:     Head: Normocephalic and atraumatic.  Eyes:     General: Lids are normal.     Conjunctiva/sclera: Conjunctivae normal.     Pupils: Pupils are equal, round, and reactive to light.  Neck:     Thyroid: No thyroid mass.     Trachea: No tracheal deviation.  Cardiovascular:     Rate and Rhythm: Normal rate and regular rhythm.     Heart sounds: Normal heart sounds. No murmur heard.   No gallop.  Pulmonary:     Effort: Pulmonary effort is normal. No respiratory distress.     Breath sounds: Normal breath sounds. No stridor. No decreased breath sounds, wheezing, rhonchi or rales.  Abdominal:     General: There is no distension.     Palpations: Abdomen is soft.     Tenderness: There is no abdominal tenderness. There is no rebound.  Musculoskeletal:        General: No tenderness. Normal range of  motion.     Cervical back: Normal range of motion and neck supple.  Skin:    General: Skin is warm and dry.     Findings: No abrasion or rash.  Neurological:     Mental Status: He is alert and oriented to person, place, and time. Mental status is at baseline.     GCS: GCS eye subscore is 4. GCS verbal subscore is 5. GCS motor subscore is 6.     Cranial Nerves: Cranial nerves are intact. No cranial nerve deficit.     Sensory: No sensory deficit.     Motor: Motor function is intact.  Psychiatric:        Attention and Perception: Attention normal.        Speech: Speech normal.        Behavior: Behavior normal.    ED Results / Procedures / Treatments   Labs (all labs ordered are listed, but only abnormal results are displayed) Labs Reviewed  RESP PANEL  BY RT-PCR (FLU A&B, COVID) [id number]    EKG None  Radiology No results found.  Procedures Procedures   Medications Ordered in ED Medications - No data to display  ED Course  I have reviewed the triage vital signs and the nursing notes.  Pertinent labs & imaging results that were available during my care of the patient were reviewed by me and considered in my medical decision making (see chart for details).    MDM Rules/Calculators/A&P                          Patient with temperature here.  His COVID test is negative.  CT of the head without acute findings.  His neurological exam is normal.  Suspect viral infection and will discharge Final Clinical Impression(s) / ED Diagnoses Final diagnoses:  None    Rx / DC Orders ED Discharge Orders     None        Lorre Nick, MD 11/15/20 1904

## 2020-11-15 NOTE — ED Notes (Signed)
Pt to xray at this time.

## 2020-11-15 NOTE — ED Notes (Signed)
RN walked in pt's room and pt is sitting up unconscious with eyes open with a stare non blinking, pale, and dry. Pt was unresponsive to verbal cues. Pt aroused while laying flat and sternum rub. Pt disoriented and became diaphoretic. Provider at bedside. EKG performed. CBG 138. IV placed with NS infusion. Pt a/o within 1 min.

## 2020-11-15 NOTE — ED Provider Notes (Signed)
[facility]    CSN: [id number] Arrival date & time: 11/15/20  7371      History   Chief Complaint Chief Complaint  Patient presents with   Nasal Congestion    HPI Lucas Morton is a 60 y.o. male presenting with nasal congestion x3 days, associated with facial pressure and headaches behind forehead x3 days. H/o pneumonia 07/2020, was recommended to repeat CXR in 4 weeks. He is here today after 12 weeks for repeat CXR.  Nonproductive cough.  Subjective chills, has not monitored his temperature, has not taken antipyretic.  He states that this morning he went from sitting to standing and started walking, felt lightheaded and tripped over a laundry basket causing him to fall.  Adamant that he did not lose consciousness before or after the fall, did not hit his head, states he remembers the entire incident. **Though patient denies losing consciousness, he again lost consciousness during his visit and was transported to the ED via EMS.Currently with 6/10 headache throbbing behind forehead. Denies musculoskeletal pain, abdominal pain.  Denies shortness of breath, chest pain, weakness in arms or legs, vision changes, , thunderclap headaches. Denies n/v/d, shortness of breath, chest pain, teeth pain, , sore throat, loss of taste/smell, swollen lymph nodes, ear pain. Denies worst headache of life, thunderclap headache, weakness/sensation changes in arms/legs, vision changes, shortness of breath, chest pain/pressure, photophobia, phonophobia, n/v/d.     HPI  History reviewed. No pertinent past medical history.  There are no problems to display for this patient.   History reviewed. No pertinent surgical history.     Home Medications    Prior to Admission medications   Medication Sig Start Date End Date Taking? Authorizing Provider  aspirin 325 MG tablet Take 325 mg by mouth daily.    [provider]  azithromycin (ZITHROMAX) 250 MG tablet Take 1 tablet (250 mg total) by  mouth daily. Take first 2 tablets together, then 1 every day until finished. 07/19/20   Wieters, Hallie C, PA-C  Multiple Vitamin (MULTIVITAMIN) tablet Take 1 tablet by mouth daily.    [provider]  traMADol (ULTRAM) 50 MG tablet Take 1 tablet (50 mg total) by mouth every 8 (eight) hours as needed. 08/10/14   Raelyn Ensign, PA    Family History History reviewed. No pertinent family history.  Social History Social History   Tobacco Use   Smoking status: Never   Smokeless tobacco: Never  Substance Use Topics   Alcohol use: No    Alcohol/week: 0.0 standard drinks   Drug use: No     Allergies   Tylenol [acetaminophen]   Review of Systems Review of Systems  Constitutional:  Negative for appetite change, chills and fever.  HENT:  Negative for congestion, ear pain, rhinorrhea, sinus pressure, sinus pain and sore throat.   Eyes:  Negative for redness and visual disturbance.  Respiratory:  Positive for cough. Negative for chest tightness, shortness of breath and wheezing.   Cardiovascular:  Negative for chest pain and palpitations.  Gastrointestinal:  Negative for abdominal pain, constipation, diarrhea, nausea and vomiting.  Genitourinary:  Negative for dysuria, frequency and urgency.  Musculoskeletal:  Negative for myalgias.  Neurological:  Positive for syncope, light-headedness and headaches. Negative for dizziness and weakness.  Psychiatric/Behavioral:  Negative for confusion.   All other systems reviewed and are negative.   Physical Exam Triage Vital Signs ED Triage Vitals  Enc Vitals Group     BP 11/15/20 1007 111/70     Pulse Rate  11/15/20 1007 96     Resp 11/15/20 1007 18     Temp 11/15/20 1007 99.8 F (37.7 C)     Temp Source 11/15/20 1007 Oral     SpO2 11/15/20 1007 95 %     Weight --      Height --      Head Circumference --      Peak Flow --      Pain Score 11/15/20 1020 0     Pain Loc --      Pain Edu? --      Excl. in GC? --    No data  found.  Updated Vital Signs BP 111/70 (BP Location: Left Arm)   Pulse 96   Temp 99.8 F (37.7 C) (Oral)   Resp 18   SpO2 95%   Visual Acuity Right Eye Distance:   Left Eye Distance:   Bilateral Distance:    Right Eye Near:   Left Eye Near:    Bilateral Near:     Physical Exam Vitals reviewed.  Constitutional:      General: He is not in acute distress.    Appearance: Normal appearance. He is not ill-appearing.  HENT:     Head: Normocephalic and atraumatic.     Comments: Normocephalic, atraumatic No oral mucosal laceration    Right Ear: Hearing, tympanic membrane, ear canal and external ear normal. No swelling or tenderness. There is no impacted cerumen. No mastoid tenderness. Tympanic membrane is not perforated, erythematous, retracted or bulging.     Left Ear: Hearing, tympanic membrane, ear canal and external ear normal. No swelling or tenderness. There is no impacted cerumen. No mastoid tenderness. Tympanic membrane is not perforated, erythematous, retracted or bulging.     Nose:     Right Sinus: No maxillary sinus tenderness or frontal sinus tenderness.     Left Sinus: No maxillary sinus tenderness or frontal sinus tenderness.     Mouth/Throat:     Mouth: Mucous membranes are moist.     Pharynx: Uvula midline. No oropharyngeal exudate or posterior oropharyngeal erythema.     Tonsils: No tonsillar exudate.  Eyes:     Extraocular Movements: Extraocular movements intact.     Pupils: Pupils are equal, round, and reactive to light.  Cardiovascular:     Rate and Rhythm: Normal rate and regular rhythm.     Heart sounds: Normal heart sounds.  Pulmonary:     Breath sounds: Normal breath sounds and air entry. No wheezing, rhonchi or rales.  Chest:     Chest wall: No tenderness.  Abdominal:     General: Abdomen is flat. Bowel sounds are normal.     Tenderness: There is no abdominal tenderness. There is no guarding or rebound.  Lymphadenopathy:     Cervical: No cervical  adenopathy.  Neurological:     General: No focal deficit present.     Mental Status: He is alert and oriented to person, place, and time.     Comments: Pt lost consciousness during visit. Following this, PERRLA, EOMI, negative rhomberg, CN 2-12 grossly intact.   Psychiatric:        Attention and Perception: Attention and perception normal.        Mood and Affect: Mood and affect normal.        Behavior: Behavior normal. Behavior is cooperative.        Thought Content: Thought content normal.        Judgment: Judgment normal.     UC  Treatments / Results  Labs (all labs ordered are listed, but only abnormal results are displayed) Labs Reviewed - No data to display  EKG   Radiology No results found.  Procedures Procedures (including critical care time)  Medications Ordered in UC Medications - No data to display  Initial Impression / Assessment and Plan / UC Course  I have reviewed the triage vital signs and the nursing notes.  Pertinent labs & imaging results that were available during my care of the patient were reviewed by me and considered in my medical decision making (see chart for details).     This patient is a very pleasant 60 y.o. year old male presenting with syncopal episode. LOC at home, presented to the urgent care with 6/10 headache. Lost consciousness again during visit. Was revived within seconds with lying flat and sternal rub. IV started. EKG unchanged from 06/2020 EKG. Nonfasting CBG 138 today. Transported to ED via EMS. Today this pt is borderline febrile nontachycardic nontachypneic, oxygenating well on room air, no wheezes rhonchi or rales. Has not taken antipyretic. Did have diagnosis of Covid-19 07/3020  Coding this visit a Level 5 as I suspect this patient will be admitted for acute stroke or similar.    Final Clinical Impressions(s) / UC Diagnoses   Final diagnoses:  Loss of consciousness [facility])     Discharge Instructions      Transported to  ED via EMS     ED Prescriptions   None    PDMP not reviewed this encounter.   Rhys Martini, PA-C 11/15/20 1054

## 2020-11-15 NOTE — ED Triage Notes (Signed)
Pt present nasal congestion with drainage, symptom started two days. Pt also states that he lost conciseness this am after I tripped over a laundry basket.

## 2020-11-15 NOTE — Discharge Instructions (Signed)
Transported to ED via EMS 

## 2020-11-19 ENCOUNTER — Other Ambulatory Visit: Payer: Self-pay

## 2020-11-19 ENCOUNTER — Encounter: Payer: Self-pay | Admitting: Emergency Medicine

## 2020-11-19 ENCOUNTER — Emergency Department: Payer: BC Managed Care – PPO

## 2020-11-19 ENCOUNTER — Emergency Department
Admission: EM | Admit: 2020-11-19 | Discharge: 2020-11-19 | Disposition: A | Discharge status: Home / Self Care | Payer: BC Managed Care – PPO | Attending: Emergency Medicine | Admitting: Emergency Medicine

## 2020-11-19 DIAGNOSIS — E860 Dehydration: Secondary | ICD-10-CM

## 2020-11-19 DIAGNOSIS — Z7982 Long term (current) use of aspirin: Secondary | ICD-10-CM | POA: Insufficient documentation

## 2020-11-19 DIAGNOSIS — D72829 Elevated white blood cell count, unspecified: Secondary | ICD-10-CM | POA: Insufficient documentation

## 2020-11-19 DIAGNOSIS — R519 Headache, unspecified: Secondary | ICD-10-CM | POA: Insufficient documentation

## 2020-11-19 DIAGNOSIS — R509 Fever, unspecified: Secondary | ICD-10-CM | POA: Diagnosis present

## 2020-11-19 DIAGNOSIS — J111 Influenza due to unidentified influenza virus with other respiratory manifestations: Secondary | ICD-10-CM | POA: Insufficient documentation

## 2020-11-19 LAB — COMPREHENSIVE METABOLIC PANEL
ALT: 53 U/L — ABNORMAL HIGH (ref 0–44)
AST: 58 U/L — ABNORMAL HIGH (ref 15–41)
Albumin: 3.3 g/dL — ABNORMAL LOW (ref 3.5–5.0)
Alkaline Phosphatase: 46 U/L (ref 38–126)
Anion gap: 10 (ref 5–15)
BUN: 14 mg/dL (ref 8–23)
CO2: 25 mmol/L (ref 22–32)
Calcium: 7.9 mg/dL — ABNORMAL LOW (ref 8.9–10.3)
Chloride: 94 mmol/L — ABNORMAL LOW (ref 98–111)
Creatinine, Ser: 0.92 mg/dL (ref 0.61–1.24)
GFR, Estimated: >60 mL/min (ref >60)
Glucose, Bld: 111 mg/dL — ABNORMAL HIGH (ref 70–99)
Potassium: 3.2 mmol/L — ABNORMAL LOW (ref 3.5–5.1)
Sodium: 129 mmol/L — ABNORMAL LOW (ref 135–145)
Total Bilirubin: 1.3 mg/dL — ABNORMAL HIGH (ref 0.3–1.2)
Total Protein: 6.8 g/dL (ref 6.5–8.1)

## 2020-11-19 LAB — LIPASE, BLOOD: Lipase: 32 U/L (ref 11–51)

## 2020-11-19 LAB — URINALYSIS, COMPLETE (UACMP) WITH MICROSCOPIC
Bilirubin Urine: NEGATIVE
Glucose, UA: NEGATIVE mg/dL
Hgb urine dipstick: NEGATIVE
Ketones, ur: 20 mg/dL — AB
Leukocytes,Ua: NEGATIVE
Nitrite: NEGATIVE
Protein, ur: 30 mg/dL — AB
Specific Gravity, Urine: 1.016 (ref 1.005–1.030)
pH: 5 (ref 5.0–8.0)

## 2020-11-19 LAB — CBC WITH DIFFERENTIAL/PLATELET
Abs Immature Granulocytes: 0.11 10*3/uL — ABNORMAL HIGH (ref 0.00–0.07)
Basophils Absolute: 0.1 10*3/uL (ref 0.0–0.1)
Basophils Relative: 1 %
Eosinophils Absolute: 0 10*3/uL (ref 0.0–0.5)
Eosinophils Relative: 0 %
HCT: 40.7 % (ref 39.0–52.0)
Hemoglobin: 14.1 g/dL (ref 13.0–17.0)
Immature Granulocytes: 1 %
Lymphocytes Relative: 33 %
Lymphs Abs: 4.2 10*3/uL — ABNORMAL HIGH (ref 0.7–4.0)
MCH: 29.5 pg (ref 26.0–34.0)
MCHC: 34.6 g/dL (ref 30.0–36.0)
MCV: 85.1 fL (ref 80.0–100.0)
Monocytes Absolute: 0.5 10*3/uL (ref 0.1–1.0)
Monocytes Relative: 4 %
Neutro Abs: 7.7 10*3/uL (ref 1.7–7.7)
Neutrophils Relative %: 61 %
Platelets: 115 10*3/uL — ABNORMAL LOW (ref 150–400)
RBC: 4.78 MIL/uL (ref 4.22–5.81)
RDW: 14.4 % (ref 11.5–15.5)
Smear Review: NORMAL
WBC: 12.5 10*3/uL — ABNORMAL HIGH (ref 4.0–10.5)
nRBC: 0 % (ref 0.0–0.2)

## 2020-11-19 LAB — SEDIMENTATION RATE: Sed Rate: 12 mm/hr (ref 0–20)

## 2020-11-19 LAB — PROCALCITONIN: Procalcitonin: 0.65 ng/mL

## 2020-11-19 LAB — TSH: TSH: 2.126 u[IU]/mL (ref 0.350–4.500)

## 2020-11-19 MED ORDER — LACTATED RINGERS IV BOLUS
1000.0000 mL | Freq: Once | INTRAVENOUS | Status: AC
  Administered 2020-11-19: 1000 mL via INTRAVENOUS

## 2020-11-19 MED ORDER — POTASSIUM CHLORIDE ER 10 MEQ PO TBCR: 10.0000 meq | EXTENDED_RELEASE_TABLET | Freq: Two times a day (BID) | ORAL | 0 refills | Status: AC

## 2020-11-19 NOTE — ED Provider Notes (Signed)
[facility] Emergency Department Provider Note  ____________________________________________  Time seen: Approximately 1:12 PM  I have reviewed the triage vital signs and the nursing notes.   HISTORY  Chief Complaint Fever    HPI Lucas Morton is a 60 y.o. male with no significant past medical history who comes ED complaining of bilateral frontal headache and subjective fever and chills for the past week, started 8 days ago.  Also had a passing out episode early on in the course of this.  Was initially seen at [facility] emergency department, had CT scan of the head, COVID and flu test, fingerstick CBG which were all normal.  He has persistently felt unwell all week with fatigue, malaise, somewhat decreased oral intake although he feels like he is still drinking adequate fluids.  Symptoms are constant, waxing and waning, no aggravating or alleviating factors.  Denies any pain.  No vomiting or diarrhea.  No shortness of breath.  Patient does work in [employer], mining.  He is frequently outdoors, often climbing on roofs and ladders and moving heavy equipment.  With [facility] summertime, temperatures have routinely been in the 90s over the past several weeks.    History reviewed. No pertinent past medical history.   There are no problems to display for this patient.    History reviewed. No pertinent surgical history.   Prior to Admission medications   Medication Sig Start Date End Date Taking? Authorizing Provider  potassium chloride (KLOR-CON) 10 MEQ tablet Take 1 tablet (10 mEq total) by mouth 2 (two) times daily for 7 days. 11/19/20 11/26/20 Yes Sharman Cheek, MD  aspirin 325 MG tablet Take 325 mg by mouth daily.    [provider]  Multiple Vitamin (MULTIVITAMIN) tablet Take 1 tablet by mouth daily.    [provider]     Allergies Tylenol [acetaminophen]   No family history on file.  Social History Social History    Tobacco Use   Smoking status: Never   Smokeless tobacco: Never  Substance Use Topics   Alcohol use: No    Alcohol/week: 0.0 standard drinks   Drug use: No    Review of Systems  Constitutional:   No fever or chills.  Positive fatigue ENT:   No sore throat. No rhinorrhea. Cardiovascular:   No chest pain positive syncope. Respiratory:   No dyspnea or cough. Gastrointestinal:   Negative for abdominal pain, vomiting and diarrhea.  Musculoskeletal:   Negative for focal pain or swelling All other systems reviewed and are negative except as documented above in ROS and HPI.  ____________________________________________   PHYSICAL EXAM:  VITAL SIGNS: ED Triage Vitals  Enc Vitals Group     BP 11/19/20 0906 110/73     Pulse Rate 11/19/20 0906 (!) 109     Resp 11/19/20 0908 16     Temp 11/19/20 0906 98.8 F (37.1 C)     Temp src --      SpO2 11/19/20 0906 94 %     Weight 11/19/20 0906 240 lb 1.3 oz (108.9 kg)     Height 11/19/20 0906 5\' 11"  (1.803 m)     Head Circumference --      Peak Flow --      Pain Score 11/19/20 0906 0     Pain Loc --      Pain Edu? --      Excl. in GC? --     Vital signs reviewed, nursing assessments reviewed.   Constitutional:   Alert  and oriented. Non-toxic appearance. Eyes:   Conjunctivae are normal. EOMI. PERRL. ENT      Head:   Normocephalic and atraumatic.      Nose:   Wearing a mask.      Mouth/Throat:   Wearing a mask.      Neck:   No meningismus. Full ROM. Hematological/Lymphatic/Immunilogical:   No cervical lymphadenopathy. Cardiovascular:   RRR. Symmetric bilateral radial and DP pulses.  No murmurs. Cap refill less than 2 seconds. Respiratory:   Normal respiratory effort without tachypnea/retractions. Breath sounds are clear and equal bilaterally. No wheezes/rales/rhonchi. Gastrointestinal:   Soft and nontender. Non distended. There is no CVA tenderness.  No rebound, rigidity, or guarding. Genitourinary:   deferred Musculoskeletal:    Normal range of motion in all extremities. No joint effusions.  No lower extremity tenderness.  No edema. Neurologic:   Normal speech and language.  Motor grossly intact. No acute focal neurologic deficits are appreciated.  Skin:    Skin is warm, dry and intact. No rash noted.  No petechiae, purpura, or bullae.  ____________________________________________    LABS (pertinent positives/negatives) (all labs ordered are listed, but only abnormal results are displayed) Labs Reviewed  URINALYSIS, COMPLETE (UACMP) WITH MICROSCOPIC - Abnormal; Notable for the following components:      Result Value   Color, Urine AMBER (*)    APPearance HAZY (*)    Ketones, ur 20 (*)    Protein, ur 30 (*)    Bacteria, UA RARE (*)    All other components within normal limits  COMPREHENSIVE METABOLIC PANEL - Abnormal; Notable for the following components:   Sodium 129 (*)    Potassium 3.2 (*)    Chloride 94 (*)    Glucose, Bld 111 (*)    Calcium 7.9 (*)    Albumin 3.3 (*)    AST 58 (*)    ALT 53 (*)    Total Bilirubin 1.3 (*)    All other components within normal limits  CBC WITH DIFFERENTIAL/PLATELET - Abnormal; Notable for the following components:   WBC 12.5 (*)    Platelets 115 (*)    Lymphs Abs 4.2 (*)    Abs Immature Granulocytes 0.11 (*)    All other components within normal limits  LIPASE, BLOOD  SEDIMENTATION RATE  PROCALCITONIN  TSH   ____________________________________________   EKG Interpreted by me Sinus tachycardia rate 106.  Normal axis and intervals.  Poor R wave progression.  Normal ST segments and T waves.  No ischemic changes.   ____________________________________________    RADIOLOGY  DG Chest 2 View  Result Date: 11/19/2020 CLINICAL DATA:  Fever. EXAM: CHEST - 2 VIEW COMPARISON:  July 16, 2020. FINDINGS: The heart size and mediastinal contours are within normal limits. Both lungs are clear. The visualized skeletal structures are unremarkable. IMPRESSION: No  active cardiopulmonary disease. Electronically Signed   By: Lupita Raider M.D.   On: 11/19/2020 10:14    ____________________________________________   PROCEDURES Procedures  ____________________________________________  DIFFERENTIAL DIAGNOSIS   Dehydration, electrolyte abnormality, UTI, pneumonia, viral illness  CLINICAL IMPRESSION / ASSESSMENT AND PLAN / ED COURSE  Medications ordered in the ED: Medications  lactated ringers bolus 1,000 mL (1,000 mLs Intravenous New Bag/Given 11/19/20 1024)    Pertinent labs & imaging results that were available during my care of the patient were reviewed by me and considered in my medical decision making (see chart for details).  Lucas Morton was evaluated in Emergency Department on 11/19/2020 for the symptoms  described in the history of present illness. He was evaluated in the context of the global COVID-19 pandemic, which necessitated consideration that the patient might be at risk for infection with the SARS-CoV-2 virus that causes COVID-19. Institutional protocols and algorithms that pertain to the evaluation of patients at risk for COVID-19 are in a state of rapid change based on information released by regulatory bodies including the CDC and federal and state organizations. These policies and algorithms were followed during the patient's care in the ED.     Clinical Course as of 11/19/20 1312  Mon Nov 19, 2020  1211 Labs showed mild leukocytosis with elevated lymphocytes.  Sed rate normal.  Procalcitonin slightly elevated, nonspecific.  Chemistry panel shows mild elevation of transaminases, mild decrease of sodium and potassium.  Suspect this is all related to viral illness. [PS]    Clinical Course User Index [PS] Sharman Cheek, MD   Discussed results with patient and spouse at bedside.  He is feeling better after IV fluids, heart rate is normalized at 90.  Offered hospitalization which they declined.  They feel comfortable with  outpatient follow-up at this time.  Advised him to increase fluid intake, salt intake, potassium intake.  Return precautions for worsening symptoms or recurrent syncope, although I think this is less likely cardiac.  CBC overall unremarkable but there is some comment of occasional abnormal cells from the lab report.  Recommend he follow-up with hematology given the somewhat vague constitutional symptoms and electrolyte normalities.  As part of my medical decision making, I considered illnesses such as meningitis, encephalitis, intracranial abscess, soft tissue infection, osteomyelitis, intra-abdominal infection including cholecystitis and pancreatitis, pneumonia, infected pleural effusion, retained kidney stone, cystitis.  Based on symptoms, exam, and work-up, I find the evaluation today to be reassuring and he stable for outpatient follow-up.  No identified bacterial source of infection, not septic, antibiotics not indicated.  ____________________________________________   FINAL CLINICAL IMPRESSION(S) / ED DIAGNOSES    Final diagnoses:  Dehydration  Influenza-like illness     ED Discharge Orders          Ordered    potassium chloride (KLOR-CON) 10 MEQ tablet  2 times daily        11/19/20 1312            Portions of this note were generated with dragon dictation software. Dictation errors may occur despite best attempts at proofreading.   Sharman Cheek, MD 11/19/20 1319

## 2020-11-19 NOTE — Discharge Instructions (Addendum)
Your labs showed slightly low levels of sodium and potassium.  Increase your salt intake over the next week and take a potassium supplement.  Be aggressive with fluids, aiming for at least half a gallon daily.  If you are out in the heat during the day, increase your fluid target to 1 gallon.    Please follow up with cardiology about the passing out episodes.   If your symptoms persist after this week, please follow up with hematology for further evaluation.

## 2020-11-19 NOTE — ED Notes (Signed)
See triage note  Presents s/p multiple syncopal episodes since last Thursday  State she was seen at [facility] at that time   had ct scans,labs and COVID tests which were all negative  State she is still not feeling any better  Subjective fever  Per family he had 2 more syncopal episodes this weekend

## 2020-11-19 NOTE — ED Triage Notes (Signed)
C/O sinus pressure and fever x 2 weeks.  Seen through [facility] on Thursday for c/o syncope.  Patient denies additional syncope, but c/o fever today.

## 2020-11-20 ENCOUNTER — Telehealth: Payer: Self-pay | Admitting: Emergency Medicine

## 2020-11-20 MED ORDER — DOXYCYCLINE HYCLATE 100 MG PO CAPS: 100.0000 mg | ORAL_CAPSULE | Freq: Two times a day (BID) | ORAL | 0 refills | Status: AC

## 2020-11-20 NOTE — ED Notes (Signed)
Reviewed pts chart and it was noted that Dr Scotty Court sent over RX for doxycycline about 30 mins ago. Pts wife had not seen message back. Pt and wife would like to forfeit being seen in ED and would like to go get the RX sent for doxycyline and try that prior to being seen again.

## 2020-11-20 NOTE — Telephone Encounter (Signed)
Spouse called back ED after departure noting additional history of tick bite 2 weeks ago. Pt had no rash, fever, or meningismus. Will send doxy rx to his pharmacy.

## 2020-11-27 ENCOUNTER — Ambulatory Visit: Payer: BC Managed Care – PPO | Admitting: Nurse Practitioner

## 2020-11-27 ENCOUNTER — Inpatient Hospital Stay: Payer: BC Managed Care – PPO

## 2020-11-27 ENCOUNTER — Encounter: Payer: Self-pay | Admitting: Oncology

## 2020-11-27 ENCOUNTER — Telehealth: Payer: Self-pay | Admitting: Nurse Practitioner

## 2020-11-27 ENCOUNTER — Inpatient Hospital Stay: Payer: BC Managed Care – PPO | Attending: Oncology | Admitting: Oncology

## 2020-11-27 VITALS — BP 111/75 | HR 123 | Temp 98.6°F | Resp 17 | Wt 212.0 lb

## 2020-11-27 DIAGNOSIS — D696 Thrombocytopenia, unspecified: Secondary | ICD-10-CM

## 2020-11-27 DIAGNOSIS — D72829 Elevated white blood cell count, unspecified: Secondary | ICD-10-CM

## 2020-11-27 DIAGNOSIS — R7401 Elevation of levels of liver transaminase levels: Secondary | ICD-10-CM | POA: Insufficient documentation

## 2020-11-27 DIAGNOSIS — Z7982 Long term (current) use of aspirin: Secondary | ICD-10-CM | POA: Diagnosis not present

## 2020-11-27 DIAGNOSIS — Z79899 Other long term (current) drug therapy: Secondary | ICD-10-CM | POA: Diagnosis not present

## 2020-11-27 DIAGNOSIS — G459 Transient cerebral ischemic attack, unspecified: Secondary | ICD-10-CM | POA: Insufficient documentation

## 2020-11-27 DIAGNOSIS — Z87891 Personal history of nicotine dependence: Secondary | ICD-10-CM | POA: Insufficient documentation

## 2020-11-27 DIAGNOSIS — D75839 Thrombocytosis, unspecified: Secondary | ICD-10-CM | POA: Insufficient documentation

## 2020-11-27 DIAGNOSIS — R634 Abnormal weight loss: Secondary | ICD-10-CM

## 2020-11-27 LAB — CBC WITH DIFFERENTIAL/PLATELET
Abs Immature Granulocytes: 0.07 10*3/uL (ref 0.00–0.07)
Basophils Absolute: 0.1 10*3/uL (ref 0.0–0.1)
Basophils Relative: 1 %
Eosinophils Absolute: 0 10*3/uL (ref 0.0–0.5)
Eosinophils Relative: 0 %
HCT: 46.8 % (ref 39.0–52.0)
Hemoglobin: 16 g/dL (ref 13.0–17.0)
Immature Granulocytes: 1 %
Lymphocytes Relative: 40 %
Lymphs Abs: 4.3 10*3/uL — ABNORMAL HIGH (ref 0.7–4.0)
MCH: 29.4 pg (ref 26.0–34.0)
MCHC: 34.2 g/dL (ref 30.0–36.0)
MCV: 86 fL (ref 80.0–100.0)
Monocytes Absolute: 1.5 10*3/uL — ABNORMAL HIGH (ref 0.1–1.0)
Monocytes Relative: 14 %
Neutro Abs: 5 10*3/uL (ref 1.7–7.7)
Neutrophils Relative %: 44 %
Platelets: 385 10*3/uL (ref 150–400)
RBC: 5.44 MIL/uL (ref 4.22–5.81)
RDW: 14.3 % (ref 11.5–15.5)
WBC: 11 10*3/uL — ABNORMAL HIGH (ref 4.0–10.5)
nRBC: 0 % (ref 0.0–0.2)

## 2020-11-27 LAB — COMPREHENSIVE METABOLIC PANEL
ALT: 80 U/L — ABNORMAL HIGH (ref 0–44)
AST: 44 U/L — ABNORMAL HIGH (ref 15–41)
Albumin: 3.8 g/dL (ref 3.5–5.0)
Alkaline Phosphatase: 40 U/L (ref 38–126)
Anion gap: 11 (ref 5–15)
BUN: 32 mg/dL — ABNORMAL HIGH (ref 8–23)
CO2: 23 mmol/L (ref 22–32)
Calcium: 9.1 mg/dL (ref 8.9–10.3)
Chloride: 99 mmol/L (ref 98–111)
Creatinine, Ser: 1.1 mg/dL (ref 0.61–1.24)
GFR, Estimated: >60 mL/min (ref >60)
Glucose, Bld: 154 mg/dL — ABNORMAL HIGH (ref 70–99)
Potassium: 3.8 mmol/L (ref 3.5–5.1)
Sodium: 133 mmol/L — ABNORMAL LOW (ref 135–145)
Total Bilirubin: 1 mg/dL (ref 0.3–1.2)
Total Protein: 7.9 g/dL (ref 6.5–8.1)

## 2020-11-27 LAB — HEPATITIS PANEL, ACUTE
HCV Ab: REACTIVE — AB
Hep A IgM: NONREACTIVE
Hep B C IgM: NONREACTIVE
Hepatitis B Surface Ag: NONREACTIVE

## 2020-11-27 LAB — VITAMIN B12: Vitamin B-12: 6057 pg/mL — ABNORMAL HIGH (ref 180–914)

## 2020-11-27 LAB — TECHNOLOGIST SMEAR REVIEW: Plt Morphology: ADEQUATE

## 2020-11-27 LAB — HIV ANTIBODY (ROUTINE TESTING W REFLEX): HIV Screen 4th Generation wRfx: NONREACTIVE

## 2020-11-27 LAB — LACTATE DEHYDROGENASE: LDH: 192 U/L (ref 98–192)

## 2020-11-27 LAB — FOLATE: Folate: >100 ng/mL (ref >5.9)

## 2020-11-27 LAB — IMMATURE PLATELET FRACTION: Immature Platelet Fraction: 2.3 % (ref 1.2–8.6)

## 2020-11-27 NOTE — Progress Notes (Signed)
Hematology/Oncology Consult note [facility] [phone] [phone]   Patient Care Team: Patient, No Pcp Per (Inactive) as PCP - General (General Practice)  REFERRING PROVIDER: Sharman Cheek, MD  CHIEF COMPLAINTS/REASON FOR VISIT:  Evaluation of leukocytosis and thrombocytopenia.  HISTORY OF PRESENTING ILLNESS:   Lucas Morton is a  60 y.o.  male with PMH listed below was seen in consultation at the request of  Sharman Cheek, MD  for evaluation of leukocytosis or thrombocytopenia. Patient has multiple ER visits within the past 6 months due to multiple complaints. He has not been feeling well due to fatigue, headache, joint aches, malaise, decreased oral intake and unintentional weight loss of 40 pounds.  No nausea vomiting diarrhea.  Symptoms are persistent, waxing and waning.  No aggravating or alleviating factors.  Patient has also had unexplained syncope episodes Previously patient was seen at [facility] ER, his work-up was essentially negative-EKG, glucose level, COVID testing, CT of the head. COVID-19 positive in March 2022.  Patient presented to emergency room [facility] on 11/19/2020 for work-up of headache. CBC showed slight leukocytosis of 12.5, platelet count 1 15,000.  Patient was referred to hematology for follow-up and evaluation.  Patient currently does not have primary care physician Patient reports history of take bite 2 weeks ago.  He has no rash, fever.  ER sent a prescription of doxycycline. He has also had tachycardia and has establish care with Dr. Juliann Pares, per patient, Dr. Juliann Pares has ordered [facility] fever work-up.  Patient has been started on doxycycline empirically.  Results is not available to me in Care Everywhere. Patient denies any alcohol use.  Former smoker, stopped in 2002.  He occasionally aches drinks tonic water with quinine.  Last use was about 6 months ago.  He takes over-the-counter vitamin  supplements.  He reports feeling a lot better since the start of doxycycline.  Review of Systems  Constitutional:  Positive for appetite change, fatigue and unexpected weight change. Negative for chills.  HENT:   Negative for hearing loss and voice change.   Eyes:  Negative for eye problems and icterus.  Respiratory:  Negative for chest tightness, cough and shortness of breath.   Cardiovascular:  Negative for chest pain and leg swelling.  Gastrointestinal:  Negative for abdominal distention and abdominal pain.  Endocrine: Negative for hot flashes.  Genitourinary:  Negative for difficulty urinating, dysuria and frequency.   Musculoskeletal:  Positive for arthralgias.  Skin:  Negative for itching and rash.  Neurological:  Positive for headaches. Negative for light-headedness and numbness.  Hematological:  Negative for adenopathy. Does not bruise/bleed easily.  Psychiatric/Behavioral:  Negative for confusion.    MEDICAL HISTORY:  Past Medical History:  Diagnosis Date   Allergy    Liver failure (HCC) 2020   Pneumonia 2022   Pre-diabetes 2021    SURGICAL HISTORY: Past Surgical History:  Procedure Laterality Date   cosmetic     finger    SOCIAL HISTORY: Social History   Socioeconomic History   Marital status: Married    Spouse name: Not on file   Number of children: Not on file   Years of education: Not on file   Highest education level: Not on file  Occupational History   Not on file  Tobacco Use   Smoking status: Former    Pack years: 0.00    Types: Cigarettes    Quit date: 2002    Years since quitting: 20.5   Smokeless tobacco: Never  Substance and  Sexual Activity   Alcohol use: No    Alcohol/week: 0.0 standard drinks   Drug use: No   Sexual activity: Not on file  Other Topics Concern   Not on file  Social History Narrative   Not on file   Social Determinants of Health   Financial Resource Strain: Not on file  Food Insecurity: Not on file   Transportation Needs: Not on file  Physical Activity: Not on file  Stress: Not on file  Social Connections: Not on file  Intimate Partner Violence: Not on file    FAMILY HISTORY: Family History  Problem Relation Age of Onset   Diabetes Mother    Tremor Mother    Lupus Father     ALLERGIES:  is allergic to acetaminophen.  MEDICATIONS:  Current Outpatient Medications  Medication Sig Dispense Refill   aspirin 325 MG tablet Take 325 mg by mouth daily.     doxycycline (VIBRAMYCIN) 100 MG capsule Take 1 capsule (100 mg total) by mouth 2 (two) times daily for 10 days. 20 capsule 0   Multiple Vitamin (MULTIVITAMIN) tablet Take 1 tablet by mouth daily.     potassium chloride (KLOR-CON) 10 MEQ tablet Take 1 tablet (10 mEq total) by mouth 2 (two) times daily for 7 days. 14 tablet 0   No current facility-administered medications for this visit.     PHYSICAL EXAMINATION: ECOG PERFORMANCE STATUS: 1 - Symptomatic but completely ambulatory Vitals:   11/27/20 1103  BP: 111/75  Pulse: (!) 123  Resp: 17  Temp: 98.6 F (37 C)  SpO2: 97%   Filed Weights   11/27/20 1103  Weight: 212 lb (96.2 kg)    Physical Exam Constitutional:      General: He is not in acute distress. HENT:     Head: Normocephalic and atraumatic.  Eyes:     General: No scleral icterus. Cardiovascular:     Rate and Rhythm: Normal rate and regular rhythm.     Heart sounds: Normal heart sounds.  Pulmonary:     Effort: Pulmonary effort is normal. No respiratory distress.     Breath sounds: No wheezing.  Abdominal:     General: Bowel sounds are normal. There is no distension.     Palpations: Abdomen is soft.  Musculoskeletal:        General: No deformity. Normal range of motion.     Cervical back: Normal range of motion and neck supple.  Skin:    General: Skin is warm and dry.     Findings: No erythema or rash.  Neurological:     Mental Status: He is alert and oriented to person, place, and time. Mental  status is at baseline.     Cranial Nerves: No cranial nerve deficit.     Coordination: Coordination normal.  Psychiatric:        Mood and Affect: Mood normal.    LABORATORY DATA:  I have reviewed the data as listed Lab Results  Component Value Date   WBC 11.0 (H) 11/27/2020   HGB 16.0 11/27/2020   HCT 46.8 11/27/2020   MCV 86.0 11/27/2020   PLT 385 11/27/2020   Recent Labs    07/16/20 0900 11/19/20 1011 11/27/20 1155  NA 142 129* 133*  K 3.9 3.2* 3.8  CL 105 94* 99  CO2 20 25 23   GLUCOSE 115* 111* 154*  BUN 13 14 32*  CREATININE 0.72* 0.92 1.10  CALCIUM 9.3 7.9* 9.1  GFRNONAA 101 >60 >60  GFRAA 116  --   --  PROT 7.4 6.8 7.9  ALBUMIN 4.0 3.3* 3.8  AST 17 58* 44*  ALT 35 53* 80*  ALKPHOS 82 46 40  BILITOT 0.4 1.3* 1.0   Iron/TIBC/Ferritin/ %Sat No results found for: IRON, TIBC, FERRITIN, IRONPCTSAT    RADIOGRAPHIC STUDIES: I have personally reviewed the radiological images as listed and agreed with the findings in the report. DG Chest 2 View  Result Date: 11/19/2020 CLINICAL DATA:  Fever. EXAM: CHEST - 2 VIEW COMPARISON:  July 16, 2020. FINDINGS: The heart size and mediastinal contours are within normal limits. Both lungs are clear. The visualized skeletal structures are unremarkable. IMPRESSION: No active cardiopulmonary disease. Electronically Signed   By: Lupita Raider M.D.   On: 11/19/2020 10:14   CT Head Wo Contrast  Result Date: 11/15/2020 CLINICAL DATA:  Dizziness.  Progressive sinus infection. EXAM: CT HEAD WITHOUT CONTRAST TECHNIQUE: Contiguous axial images were obtained from the base of the skull through the vertex without intravenous contrast. COMPARISON:  None. FINDINGS: Brain: No evidence of acute infarction, hemorrhage, hydrocephalus, extra-axial collection or mass lesion/mass effect. Vascular: No hyperdense vessel or unexpected calcification. Skull: Normal. Negative for fracture or focal lesion. Sinuses/Orbits: No acute finding. Other: None  IMPRESSION: No acute intracranial abnormalities. Normal brain. Electronically Signed   By: Signa Kell M.D.   On: 11/15/2020 18:00      ASSESSMENT & PLAN:  1. Leukocytosis, unspecified type   2. Transaminitis   3. Thrombocytopenia (HCC)   4. Weight loss    # Leukocytosis and thrombocytopenia.  Possibly reactive.  Repeat cbc, check B12, Folate, hepatitis, HIV, immature platelet fraction, LDH, SPEP, smear.   #Unintentional weight loss.  Etiology unknown.  Pending above work-up.  Check flow cytometry. #Transaminitis, check hepatitis.   Orders Placed This Encounter  Procedures   Vitamin B12    Standing Status:   Future    Number of Occurrences:   1    Standing Expiration Date:   11/27/2021   Folate    Standing Status:   Future    Number of Occurrences:   1    Standing Expiration Date:   11/27/2021   Technologist smear review    Standing Status:   Future    Number of Occurrences:   1    Standing Expiration Date:   11/27/2021   CBC with Differential/Platelet    Standing Status:   Future    Number of Occurrences:   1    Standing Expiration Date:   11/27/2021   Comprehensive metabolic panel    Standing Status:   Future    Number of Occurrences:   1    Standing Expiration Date:   11/27/2021   Lactate dehydrogenase    Standing Status:   Future    Number of Occurrences:   1    Standing Expiration Date:   11/27/2021   Immature Platelet Fraction    Standing Status:   Future    Number of Occurrences:   1    Standing Expiration Date:   11/27/2021   HIV Antibody (routine testing w rflx)    Standing Status:   Future    Number of Occurrences:   1    Standing Expiration Date:   11/27/2021   Hepatitis panel, acute    Standing Status:   Future    Number of Occurrences:   1    Standing Expiration Date:   11/27/2021   Flow cytometry panel-leukemia/lymphoma work-up    Standing Status:   Future  Number of Occurrences:   1    Standing Expiration Date:   11/27/2021   Protein electrophoresis, serum     Standing Status:   Future    Number of Occurrences:   1    Standing Expiration Date:   11/27/2021    All questions were answered. The patient knows to call the clinic with any problems questions or concerns.  cc Sharman Cheek, MD    Return of visit: 2 to 3 weeks virtually to discuss results. Thank you for this kind referral and the opportunity to participate in the care of this patient. A copy of today's note is routed to referring provider    Rickard Patience, MD, PhD Hematology Oncology [facility] at [facility] Pager- [phone] 11/27/2020

## 2020-11-27 NOTE — Telephone Encounter (Signed)
Patients spouse contacted office to cancel appointment. Advised this would be considered a no show per the no show policy since there was not a 24 hr time notification. She verbalized understanding. AS, CMA

## 2020-11-27 NOTE — Progress Notes (Signed)
Patient here for oncology follow-up appointment, expresses concerns difficultly swallowing, weight loss and elevated HR

## 2020-11-28 LAB — PROTEIN ELECTROPHORESIS, SERUM
A/G Ratio: 0.9 (ref 0.7–1.7)
Albumin ELP: 3.8 g/dL (ref 2.9–4.4)
Alpha-1-Globulin: 0.2 g/dL (ref 0.0–0.4)
Alpha-2-Globulin: 0.6 g/dL (ref 0.4–1.0)
Beta Globulin: 1 g/dL (ref 0.7–1.3)
Gamma Globulin: 2.3 g/dL — ABNORMAL HIGH (ref 0.4–1.8)
Globulin, Total: 4.1 g/dL — ABNORMAL HIGH (ref 2.2–3.9)
Total Protein ELP: 7.9 g/dL (ref 6.0–8.5)

## 2020-11-30 LAB — COMP PANEL: LEUKEMIA/LYMPHOMA

## 2020-12-19 ENCOUNTER — Inpatient Hospital Stay (HOSPITAL_BASED_OUTPATIENT_CLINIC_OR_DEPARTMENT_OTHER): Payer: BC Managed Care – PPO | Admitting: Oncology

## 2020-12-19 ENCOUNTER — Other Ambulatory Visit: Payer: Self-pay

## 2020-12-19 ENCOUNTER — Encounter: Payer: Self-pay | Admitting: Oncology

## 2020-12-19 DIAGNOSIS — D72829 Elevated white blood cell count, unspecified: Secondary | ICD-10-CM | POA: Diagnosis not present

## 2020-12-19 DIAGNOSIS — Z8619 Personal history of other infectious and parasitic diseases: Secondary | ICD-10-CM

## 2020-12-19 DIAGNOSIS — R760 Raised antibody titer: Secondary | ICD-10-CM | POA: Diagnosis not present

## 2020-12-19 DIAGNOSIS — R7401 Elevation of levels of liver transaminase levels: Secondary | ICD-10-CM | POA: Diagnosis not present

## 2020-12-19 NOTE — Progress Notes (Signed)
HEMATOLOGY-ONCOLOGY TeleHEALTH VISIT PROGRESS NOTE  I connected with Lucas Morton on 12/19/20  at  3:00 PM EDT by video enabled telemedicine visit and verified that I am speaking with the correct person using two identifiers. I discussed the limitations, risks, security and privacy concerns of performing an evaluation and management service by telemedicine and the availability of in-person appointments. The patient expressed understanding and agreed to proceed.   Other persons participating in the visit and their role in the encounter:  None  Patient's location: Home  Provider's location: office Chief Complaint: Thrombocytopenia, to discuss blood work.   INTERVAL HISTORY Lucas Morton is a 60 y.o. male who has above history reviewed by me today presents for follow up visit for management of thrombocytopenia Problems and complaints are listed below: Patient reports no new complaints.  He presents virtually to discuss results.  Reports feeling much better after he was treated with doxycycline.  Review of Systems  Constitutional:  Negative for appetite change, chills, fatigue, fever and unexpected weight change.  HENT:   Negative for hearing loss and voice change.   Eyes:  Negative for eye problems and icterus.  Respiratory:  Negative for chest tightness, cough and shortness of breath.   Cardiovascular:  Negative for chest pain and leg swelling.  Gastrointestinal:  Negative for abdominal distention and abdominal pain.  Endocrine: Negative for hot flashes.  Genitourinary:  Negative for difficulty urinating, dysuria and frequency.   Musculoskeletal:  Negative for arthralgias.  Skin:  Negative for itching and rash.  Neurological:  Negative for light-headedness and numbness.  Hematological:  Negative for adenopathy. Does not bruise/bleed easily.  Psychiatric/Behavioral:  Negative for confusion.    Past Medical History:  Diagnosis Date   Allergy    Hepatitis C    Liver failure (HCC) 2020    Pneumonia 2022   Pre-diabetes 2021   Past Surgical History:  Procedure Laterality Date   cosmetic     finger    Family History  Problem Relation Age of Onset   Diabetes Mother    Tremor Mother    Lupus Father     Social History   Socioeconomic History   Marital status: Married    Spouse name: Not on file   Number of children: Not on file   Years of education: Not on file   Highest education level: Not on file  Occupational History   Not on file  Tobacco Use   Smoking status: Former    Types: Cigarettes    Quit date: 2002    Years since quitting: 20.5   Smokeless tobacco: Never  Substance and Sexual Activity   Alcohol use: No    Alcohol/week: 0.0 standard drinks   Drug use: No   Sexual activity: Not on file  Other Topics Concern   Not on file  Social History Narrative   Not on file   Social Determinants of Health   Financial Resource Strain: Not on file  Food Insecurity: Not on file  Transportation Needs: Not on file  Physical Activity: Not on file  Stress: Not on file  Social Connections: Not on file  Intimate Partner Violence: Not on file    Current Outpatient Medications on File Prior to Visit  Medication Sig Dispense Refill   aspirin 325 MG tablet Take 325 mg by mouth daily.     Multiple Vitamin (MULTIVITAMIN) tablet Take 1 tablet by mouth daily.     potassium chloride (KLOR-CON) 10 MEQ tablet Take 1 tablet (10 mEq total)  by mouth 2 (two) times daily for 7 days. 14 tablet 0   No current facility-administered medications on file prior to visit.    Allergies  Allergen Reactions   Acetaminophen Other (See Comments)       Observations/Objective: There were no vitals filed for this visit. There is no height or weight on file to calculate BMI.  Physical Exam  CBC    Component Value Date/Time   WBC 11.0 (H) 11/27/2020 1155   RBC 5.44 11/27/2020 1155   HGB 16.0 11/27/2020 1155   HGB 15.7 07/16/2020 0900   HCT 46.8 11/27/2020 1155   HCT 45.9  07/16/2020 0900   PLT 385 11/27/2020 1155   PLT 232 07/16/2020 0900   MCV 86.0 11/27/2020 1155   MCV 87 07/16/2020 0900   MCH 29.4 11/27/2020 1155   MCHC 34.2 11/27/2020 1155   RDW 14.3 11/27/2020 1155   RDW 14.1 07/16/2020 0900   LYMPHSABS 4.3 (H) 11/27/2020 1155   LYMPHSABS 1.8 07/16/2020 0900   MONOABS 1.5 (H) 11/27/2020 1155   EOSABS 0.0 11/27/2020 1155   EOSABS 0.2 07/16/2020 0900   BASOSABS 0.1 11/27/2020 1155   BASOSABS 0.0 07/16/2020 0900    CMP     Component Value Date/Time   NA 133 (L) 11/27/2020 1155   NA 142 07/16/2020 0900   K 3.8 11/27/2020 1155   CL 99 11/27/2020 1155   CO2 23 11/27/2020 1155   GLUCOSE 154 (H) 11/27/2020 1155   BUN 32 (H) 11/27/2020 1155   BUN 13 07/16/2020 0900   CREATININE 1.10 11/27/2020 1155   CALCIUM 9.1 11/27/2020 1155   PROT 7.9 11/27/2020 1155   PROT 7.4 07/16/2020 0900   ALBUMIN 3.8 11/27/2020 1155   ALBUMIN 4.0 07/16/2020 0900   AST 44 (H) 11/27/2020 1155   ALT 80 (H) 11/27/2020 1155   ALKPHOS 40 11/27/2020 1155   BILITOT 1.0 11/27/2020 1155   BILITOT 0.4 07/16/2020 0900   GFRNONAA >60 11/27/2020 1155   GFRAA 116 07/16/2020 0900     Assessment and Plan: 1. Leukocytosis, unspecified type   2. Anticardiolipin antibody positive   3. Transaminitis   4. History of hepatitis C    Labs are reviewed and discussed with patient. #Thrombocytopenia has completely resolved #Leukocytosis has improved.  WBC 11, lymphocytosis and monocytosis.  Clinically this is probably secondary to his tickborne disease recently.  All of his symptoms have improved after treatment of doxycycline. Peripheral blood flow cytometry showed increased gamma delta T cells, 10% of the leukocytes.  This is probably secondary to recent infection, this can be seen in neoplastic processes. Recommend to repeat flow cytometry.  If is persistently there, consider checking T-cell receptor gene rearrangement.  #Emergency room, anticardiolipin antibody profile was pan  positive.  Checked by ER physician.  I think this is most likely due to acute infection.  Patient has never had personal history of thrombosis.  Plan to repeat in 12 weeks.  #Transaminitis, repeat LFT in 2 weeks. Hepatitis panel showed hepatitis C positive.  Patient reports a history of treated hepatitis C remotely.  He is currently not followed by any hepatology. Check hepatitis C viral load.  Follow Up Instructions: Lab encounter in 2 weeks Check HCV RNA, CBC, flow cytometry  Labs in October 2022, check LFT, cardiolipin antibody, CBC.   I discussed the assessment and treatment plan with the patient. The patient was provided an opportunity to ask questions and all were answered. The patient agreed with the plan and demonstrated  an understanding of the instructions.  The patient was advised to call back or seek an in-person evaluation if the symptoms worsen or if the condition fails to improve as anticipated.    Rickard Patience, MD 12/19/2020 11:08 PM

## 2020-12-20 ENCOUNTER — Encounter: Payer: Self-pay | Admitting: Oncology

## 2020-12-27 ENCOUNTER — Ambulatory Visit: Payer: Self-pay | Admitting: Internal Medicine

## 2021-01-01 ENCOUNTER — Inpatient Hospital Stay: Payer: BC Managed Care – PPO | Attending: Oncology

## 2021-01-01 ENCOUNTER — Other Ambulatory Visit: Payer: Self-pay

## 2021-01-01 DIAGNOSIS — B1920 Unspecified viral hepatitis C without hepatic coma: Secondary | ICD-10-CM | POA: Diagnosis not present

## 2021-01-01 DIAGNOSIS — Z8619 Personal history of other infectious and parasitic diseases: Secondary | ICD-10-CM | POA: Insufficient documentation

## 2021-01-01 DIAGNOSIS — R7401 Elevation of levels of liver transaminase levels: Secondary | ICD-10-CM | POA: Insufficient documentation

## 2021-01-01 DIAGNOSIS — D72829 Elevated white blood cell count, unspecified: Secondary | ICD-10-CM

## 2021-01-01 DIAGNOSIS — D696 Thrombocytopenia, unspecified: Secondary | ICD-10-CM | POA: Diagnosis not present

## 2021-01-01 LAB — CBC WITH DIFFERENTIAL/PLATELET
Abs Immature Granulocytes: 0.02 10*3/uL (ref 0.00–0.07)
Basophils Absolute: 0 10*3/uL (ref 0.0–0.1)
Basophils Relative: 0 %
Eosinophils Absolute: 0.2 10*3/uL (ref 0.0–0.5)
Eosinophils Relative: 2 %
HCT: 41.4 % (ref 39.0–52.0)
Hemoglobin: 14.2 g/dL (ref 13.0–17.0)
Immature Granulocytes: 0 %
Lymphocytes Relative: 31 %
Lymphs Abs: 2.8 10*3/uL (ref 0.7–4.0)
MCH: 31.1 pg (ref 26.0–34.0)
MCHC: 34.3 g/dL (ref 30.0–36.0)
MCV: 90.6 fL (ref 80.0–100.0)
Monocytes Absolute: 0.8 10*3/uL (ref 0.1–1.0)
Monocytes Relative: 8 %
Neutro Abs: 5.4 10*3/uL (ref 1.7–7.7)
Neutrophils Relative %: 59 %
Platelets: 206 10*3/uL (ref 150–400)
RBC: 4.57 MIL/uL (ref 4.22–5.81)
RDW: 16 % — ABNORMAL HIGH (ref 11.5–15.5)
WBC: 9.2 10*3/uL (ref 4.0–10.5)
nRBC: 0 % (ref 0.0–0.2)

## 2021-01-02 LAB — HCV RNA QUANT RFLX ULTRA OR GENOTYP
HCV RNA Qnt(log copy/mL): UNDETERMINED log10 IU/mL
HepC Qn: NOT DETECTED IU/mL

## 2021-01-04 LAB — COMP PANEL: LEUKEMIA/LYMPHOMA

## 2021-01-05 ENCOUNTER — Other Ambulatory Visit: Payer: Self-pay | Admitting: Oncology

## 2021-01-05 DIAGNOSIS — D72829 Elevated white blood cell count, unspecified: Secondary | ICD-10-CM

## 2021-01-16 ENCOUNTER — Telehealth: Payer: Self-pay

## 2021-01-16 NOTE — Telephone Encounter (Signed)
Received Short term disablity form from "[employer] No work restrictions from hematology aspect. Fax sent back to forward form to pt's PCP.

## 2021-03-05 ENCOUNTER — Inpatient Hospital Stay: Payer: BC Managed Care – PPO | Attending: Oncology

## 2021-03-05 ENCOUNTER — Other Ambulatory Visit: Payer: Self-pay

## 2021-03-05 DIAGNOSIS — D696 Thrombocytopenia, unspecified: Secondary | ICD-10-CM | POA: Insufficient documentation

## 2021-03-05 DIAGNOSIS — Z87891 Personal history of nicotine dependence: Secondary | ICD-10-CM | POA: Insufficient documentation

## 2021-03-05 DIAGNOSIS — R7401 Elevation of levels of liver transaminase levels: Secondary | ICD-10-CM

## 2021-03-05 DIAGNOSIS — Z79899 Other long term (current) drug therapy: Secondary | ICD-10-CM | POA: Diagnosis not present

## 2021-03-05 DIAGNOSIS — B1920 Unspecified viral hepatitis C without hepatic coma: Secondary | ICD-10-CM | POA: Diagnosis not present

## 2021-03-05 DIAGNOSIS — R760 Raised antibody titer: Secondary | ICD-10-CM

## 2021-03-05 DIAGNOSIS — Z7982 Long term (current) use of aspirin: Secondary | ICD-10-CM | POA: Diagnosis not present

## 2021-03-05 DIAGNOSIS — D72829 Elevated white blood cell count, unspecified: Secondary | ICD-10-CM

## 2021-03-05 LAB — CBC WITH DIFFERENTIAL/PLATELET
Abs Immature Granulocytes: 0.02 10*3/uL (ref 0.00–0.07)
Basophils Absolute: 0 10*3/uL (ref 0.0–0.1)
Basophils Relative: 1 %
Eosinophils Absolute: 0.2 10*3/uL (ref 0.0–0.5)
Eosinophils Relative: 2 %
HCT: 46.7 % (ref 39.0–52.0)
Hemoglobin: 16.1 g/dL (ref 13.0–17.0)
Immature Granulocytes: 0 %
Lymphocytes Relative: 28 %
Lymphs Abs: 2.5 10*3/uL (ref 0.7–4.0)
MCH: 31.1 pg (ref 26.0–34.0)
MCHC: 34.5 g/dL (ref 30.0–36.0)
MCV: 90.3 fL (ref 80.0–100.0)
Monocytes Absolute: 0.7 10*3/uL (ref 0.1–1.0)
Monocytes Relative: 8 %
Neutro Abs: 5.4 10*3/uL (ref 1.7–7.7)
Neutrophils Relative %: 61 %
Platelets: 225 10*3/uL (ref 150–400)
RBC: 5.17 MIL/uL (ref 4.22–5.81)
RDW: 13.2 % (ref 11.5–15.5)
WBC: 8.9 10*3/uL (ref 4.0–10.5)
nRBC: 0 % (ref 0.0–0.2)

## 2021-03-05 LAB — HEPATIC FUNCTION PANEL
ALT: 27 U/L (ref 0–44)
AST: 21 U/L (ref 15–41)
Albumin: 4.4 g/dL (ref 3.5–5.0)
Alkaline Phosphatase: 61 U/L (ref 38–126)
Bilirubin, Direct: <0.1 mg/dL (ref 0.0–0.2)
Total Bilirubin: 0.7 mg/dL (ref 0.3–1.2)
Total Protein: 7.4 g/dL (ref 6.5–8.1)

## 2021-03-06 LAB — CARDIOLIPIN ANTIBODIES, IGG, IGM, IGA
Anticardiolipin IgA: <9 APL U/mL (ref 0–11)
Anticardiolipin IgG: <9 GPL U/mL (ref 0–14)
Anticardiolipin IgM: <9 MPL U/mL (ref 0–12)

## 2021-03-11 ENCOUNTER — Other Ambulatory Visit: Payer: Self-pay

## 2021-03-11 ENCOUNTER — Inpatient Hospital Stay (HOSPITAL_BASED_OUTPATIENT_CLINIC_OR_DEPARTMENT_OTHER): Payer: BC Managed Care – PPO | Admitting: Oncology

## 2021-03-11 ENCOUNTER — Encounter: Payer: Self-pay | Admitting: Oncology

## 2021-03-11 DIAGNOSIS — Z8619 Personal history of other infectious and parasitic diseases: Secondary | ICD-10-CM

## 2021-03-11 DIAGNOSIS — D72829 Elevated white blood cell count, unspecified: Secondary | ICD-10-CM | POA: Diagnosis not present

## 2021-03-11 DIAGNOSIS — R21 Rash and other nonspecific skin eruption: Secondary | ICD-10-CM | POA: Diagnosis not present

## 2021-03-11 LAB — MISC LABCORP TEST (SEND OUT): Labcorp test code: 481080

## 2021-03-11 NOTE — Progress Notes (Signed)
HEMATOLOGY-ONCOLOGY TeleHEALTH VISIT PROGRESS NOTE  I connected with Lucas Morton on 03/11/21  at  2:45 PM EDT by video enabled telemedicine visit and verified that I am speaking with the correct person using two identifiers. I discussed the limitations, risks, security and privacy concerns of performing an evaluation and management service by telemedicine and the availability of in-person appointments. The patient expressed understanding and agreed to proceed.   Other persons participating in the visit and their role in the encounter:  None  Patient's location: Home  Provider's location: office Chief Complaint: Thrombocytopenia, to discuss blood work.   INTERVAL HISTORY Lucas Morton is a 60 y.o. male who has above history reviewed by me today presents for follow up visit for management of thrombocytopenia He reports feeling well.  No new complaints. Denies weight loss, fever, chills, fatigue, night sweats.  .   Review of Systems  Constitutional:  Negative for appetite change, chills, fatigue, fever and unexpected weight change.  HENT:   Negative for hearing loss and voice change.   Eyes:  Negative for eye problems and icterus.  Respiratory:  Negative for chest tightness, cough and shortness of breath.   Cardiovascular:  Negative for chest pain and leg swelling.  Gastrointestinal:  Negative for abdominal distention and abdominal pain.  Endocrine: Negative for hot flashes.  Genitourinary:  Negative for difficulty urinating, dysuria and frequency.   Musculoskeletal:  Negative for arthralgias.  Skin:  Negative for itching and rash.  Neurological:  Negative for light-headedness and numbness.  Hematological:  Negative for adenopathy. Does not bruise/bleed easily.  Psychiatric/Behavioral:  Negative for confusion.    Past Medical History:  Diagnosis Date   Allergy    Hepatitis C    Liver failure (HCC) 2020   Pneumonia 2022   Pre-diabetes 2021   Past Surgical History:  Procedure  Laterality Date   cosmetic     finger    Family History  Problem Relation Age of Onset   Diabetes Mother    Tremor Mother    Lupus Father     Social History   Socioeconomic History   Marital status: Married    Spouse name: Not on file   Number of children: Not on file   Years of education: Not on file   Highest education level: Not on file  Occupational History   Not on file  Tobacco Use   Smoking status: Former    Types: Cigarettes    Quit date: 2002    Years since quitting: 20.8   Smokeless tobacco: Never  Substance and Sexual Activity   Alcohol use: No    Alcohol/week: 0.0 standard drinks   Drug use: No   Sexual activity: Not on file  Other Topics Concern   Not on file  Social History Narrative   Not on file   Social Determinants of Health   Financial Resource Strain: Not on file  Food Insecurity: Not on file  Transportation Needs: Not on file  Physical Activity: Not on file  Stress: Not on file  Social Connections: Not on file  Intimate Partner Violence: Not on file    Current Outpatient Medications on File Prior to Visit  Medication Sig Dispense Refill   aspirin 325 MG tablet Take 325 mg by mouth daily.     Multiple Vitamin (MULTIVITAMIN) tablet Take 1 tablet by mouth daily.     potassium chloride (KLOR-CON) 10 MEQ tablet Take 1 tablet (10 mEq total) by mouth 2 (two) times daily for 7 days. (Patient  not taking: Reported on 03/11/2021) 14 tablet 0   No current facility-administered medications on file prior to visit.    Allergies  Allergen Reactions   Acetaminophen Other (See Comments)       Observations/Objective: Today's Vitals   03/11/21 1436  PainSc: 0-No pain   There is no height or weight on file to calculate BMI.  Physical Exam Neurological:     Mental Status: He is alert.    CBC    Component Value Date/Time   WBC 8.9 03/05/2021 1140   RBC 5.17 03/05/2021 1140   HGB 16.1 03/05/2021 1140   HGB 15.7 07/16/2020 0900   HCT 46.7  03/05/2021 1140   HCT 45.9 07/16/2020 0900   PLT 225 03/05/2021 1140   PLT 232 07/16/2020 0900   MCV 90.3 03/05/2021 1140   MCV 87 07/16/2020 0900   MCH 31.1 03/05/2021 1140   MCHC 34.5 03/05/2021 1140   RDW 13.2 03/05/2021 1140   RDW 14.1 07/16/2020 0900   LYMPHSABS 2.5 03/05/2021 1140   LYMPHSABS 1.8 07/16/2020 0900   MONOABS 0.7 03/05/2021 1140   EOSABS 0.2 03/05/2021 1140   EOSABS 0.2 07/16/2020 0900   BASOSABS 0.0 03/05/2021 1140   BASOSABS 0.0 07/16/2020 0900    CMP     Component Value Date/Time   NA 133 (L) 11/27/2020 1155   NA 142 07/16/2020 0900   K 3.8 11/27/2020 1155   CL 99 11/27/2020 1155   CO2 23 11/27/2020 1155   GLUCOSE 154 (H) 11/27/2020 1155   BUN 32 (H) 11/27/2020 1155   BUN 13 07/16/2020 0900   CREATININE 1.10 11/27/2020 1155   CALCIUM 9.1 11/27/2020 1155   PROT 7.4 03/05/2021 1140   PROT 7.4 07/16/2020 0900   ALBUMIN 4.4 03/05/2021 1140   ALBUMIN 4.0 07/16/2020 0900   AST 21 03/05/2021 1140   ALT 27 03/05/2021 1140   ALKPHOS 61 03/05/2021 1140   BILITOT 0.7 03/05/2021 1140   BILITOT 0.4 07/16/2020 0900   GFRNONAA >60 11/27/2020 1155   GFRAA 116 07/16/2020 0900     Assessment and Plan: 1. Leukocytosis, unspecified type   2. Skin rash   3. History of hepatitis C    Labs are reviewed and discussed with patient.  #Thrombocytopenia has completely resolved #Leukocytosis resolved.  Previous flowcytometry showed no significant immunophenotypic abnormality detected  Increased gamma-delta (GD) T cells, representing 10% of leukocytes Clonal T cell receptor gamma population was detected.  He has no constitutional symptoms and his counts are normalized.  I recommend observation.    Skin rash on his left cheek for several months. recommend him to see dermatology for biopsy. Rule out cutaneous T cell leukemia. Patient will further discuss with his pcp.   #Emergency room, anticardiolipin antibody profile was pan positive.  Checked by ER physician.   I think this is most likely due to acute infection.  Patient has never had personal history of thrombosis.  Repeat anticardiolipin antibody is negative. Marland Kitchen  #Transaminitis, repeat LFT in 2 weeks. Hepatitis panel showed hepatitis C positive.  Patient reports a history of treated hepatitis C remotely.  He is currently not followed by any hepatology. hepatitis C viral load is undetectable.   Note will be sent to his PCP.   Follow Up Instructions:  6 months.   I discussed the assessment and treatment plan with the patient. The patient was provided an opportunity to ask questions and all were answered. The patient agreed with the plan and demonstrated an understanding of  the instructions.  The patient was advised to call back or seek an in-person evaluation if the symptoms worsen or if the condition fails to improve as anticipated.    Rickard Patience, MD 03/11/2021 10:02 PM

## 2021-03-11 NOTE — Progress Notes (Signed)
Contacted pt for Mychart visit. No new concerns voiced.  

## 2021-09-09 ENCOUNTER — Inpatient Hospital Stay: Payer: BC Managed Care – PPO | Attending: Oncology

## 2021-09-09 DIAGNOSIS — L989 Disorder of the skin and subcutaneous tissue, unspecified: Secondary | ICD-10-CM | POA: Insufficient documentation

## 2021-09-09 DIAGNOSIS — D72829 Elevated white blood cell count, unspecified: Secondary | ICD-10-CM | POA: Diagnosis present

## 2021-09-09 DIAGNOSIS — Z87891 Personal history of nicotine dependence: Secondary | ICD-10-CM | POA: Diagnosis not present

## 2021-09-09 DIAGNOSIS — R21 Rash and other nonspecific skin eruption: Secondary | ICD-10-CM | POA: Insufficient documentation

## 2021-09-09 LAB — COMPREHENSIVE METABOLIC PANEL
ALT: 31 U/L (ref 0–44)
AST: 23 U/L (ref 15–41)
Albumin: 4.1 g/dL (ref 3.5–5.0)
Alkaline Phosphatase: 70 U/L (ref 38–126)
Anion gap: 8 (ref 5–15)
BUN: 17 mg/dL (ref 8–23)
CO2: 24 mmol/L (ref 22–32)
Calcium: 9.2 mg/dL (ref 8.9–10.3)
Chloride: 105 mmol/L (ref 98–111)
Creatinine, Ser: 0.68 mg/dL (ref 0.61–1.24)
GFR, Estimated: >60 mL/min (ref >60)
Glucose, Bld: 123 mg/dL — ABNORMAL HIGH (ref 70–99)
Potassium: 3.9 mmol/L (ref 3.5–5.1)
Sodium: 137 mmol/L (ref 135–145)
Total Bilirubin: 0.7 mg/dL (ref 0.3–1.2)
Total Protein: 7.8 g/dL (ref 6.5–8.1)

## 2021-09-09 LAB — CBC WITH DIFFERENTIAL/PLATELET
Abs Immature Granulocytes: 0.03 10*3/uL (ref 0.00–0.07)
Basophils Absolute: 0 10*3/uL (ref 0.0–0.1)
Basophils Relative: 0 %
Eosinophils Absolute: 0.2 10*3/uL (ref 0.0–0.5)
Eosinophils Relative: 2 %
HCT: 48.3 % (ref 39.0–52.0)
Hemoglobin: 16.4 g/dL (ref 13.0–17.0)
Immature Granulocytes: 0 %
Lymphocytes Relative: 22 %
Lymphs Abs: 2.4 10*3/uL (ref 0.7–4.0)
MCH: 30.1 pg (ref 26.0–34.0)
MCHC: 34 g/dL (ref 30.0–36.0)
MCV: 88.6 fL (ref 80.0–100.0)
Monocytes Absolute: 0.8 10*3/uL (ref 0.1–1.0)
Monocytes Relative: 8 %
Neutro Abs: 7.2 10*3/uL (ref 1.7–7.7)
Neutrophils Relative %: 68 %
Platelets: 243 10*3/uL (ref 150–400)
RBC: 5.45 MIL/uL (ref 4.22–5.81)
RDW: 14.1 % (ref 11.5–15.5)
WBC: 10.6 10*3/uL — ABNORMAL HIGH (ref 4.0–10.5)
nRBC: 0 % (ref 0.0–0.2)

## 2021-09-09 LAB — LACTATE DEHYDROGENASE: LDH: 118 U/L (ref 98–192)

## 2021-09-11 ENCOUNTER — Encounter: Payer: Self-pay | Admitting: Oncology

## 2021-09-11 ENCOUNTER — Inpatient Hospital Stay (HOSPITAL_BASED_OUTPATIENT_CLINIC_OR_DEPARTMENT_OTHER): Payer: BC Managed Care – PPO | Admitting: Oncology

## 2021-09-11 VITALS — BP 135/89 | HR 84 | Temp 96.7°F | Wt 256.0 lb

## 2021-09-11 DIAGNOSIS — R21 Rash and other nonspecific skin eruption: Secondary | ICD-10-CM | POA: Diagnosis not present

## 2021-09-11 DIAGNOSIS — D72828 Other elevated white blood cell count: Secondary | ICD-10-CM | POA: Diagnosis not present

## 2021-09-11 DIAGNOSIS — D72829 Elevated white blood cell count, unspecified: Secondary | ICD-10-CM | POA: Diagnosis not present

## 2021-09-11 LAB — COMP PANEL: LEUKEMIA/LYMPHOMA

## 2021-09-11 NOTE — Progress Notes (Signed)
?Hematology/Oncology Progress note ?[phone] [phone] ?  ? ? ? ?Patient Care Team: ?Ethelene Browns, NP as PCP - General (Family Medicine) ? ?REFERRING PROVIDER: ?Ethelene Browns, NP  ?CHIEF COMPLAINTS/REASON FOR VISIT:  ?Leukocytosis ? ?HISTORY OF PRESENTING ILLNESS:  ? ?Lucas Morton is a  61 y.o.  male with PMH listed below was seen in consultation at the request of  Ethelene Browns, NP  for evaluation of leukocytosis or thrombocytopenia. ?Patient has multiple ER visits within the past 6 months due to multiple complaints. ?He has not been feeling well due to fatigue, headache, joint aches, malaise, decreased oral intake and unintentional weight loss of 40 pounds.  No nausea vomiting diarrhea.  Symptoms are persistent, waxing and waning.  No aggravating or alleviating factors.  Patient has also had unexplained syncope episodes ?Previously patient was seen at [facility], his work-up was essentially negative-EKG, glucose level, COVID testing, CT of the head. ?COVID-19 positive in March 2022. ? ?Patient presented to emergency room [facility] on 11/19/2020 for work-up of headache. ?CBC showed slight leukocytosis of 12.5, platelet count 1 15,000.  Patient was referred to hematology for follow-up and evaluation.  Patient currently does not have primary care physician ?Patient reports history of take bite 2 weeks ago.  He has no rash, fever.  ER sent a prescription of doxycycline. ?He has also had tachycardia and has establish care with Dr. Juliann Pares, per patient, Dr. Juliann Pares has ordered [facility] fever work-up.  Patient has been started on doxycycline empirically.  Results is not available to me in Care Everywhere. ?Patient denies any alcohol use.  Former smoker, stopped in 2002.  He occasionally aches drinks tonic water with quinine.  Last use was about 6 months ago.  He takes over-the-counter vitamin supplements. ? ?INTERVAL HISTORY ?Lucas Morton is a 61 y.o. male who has above history  reviewed by me today presents for follow up visit for leukocytosis. ?Denies any unintentional weight loss, night sweats, fever. ?He has not seen dermatologist yet.  He has had skin lesion on his cheek as well as left upper arm. ? ? ? ?Review of Systems  ?Constitutional:  Negative for appetite change, chills, fatigue, fever and unexpected weight change.  ?HENT:   Negative for hearing loss and voice change.   ?Eyes:  Negative for eye problems and icterus.  ?Respiratory:  Negative for chest tightness, cough and shortness of breath.   ?Cardiovascular:  Negative for chest pain and leg swelling.  ?Gastrointestinal:  Negative for abdominal distention and abdominal pain.  ?Endocrine: Negative for hot flashes.  ?Genitourinary:  Negative for difficulty urinating, dysuria and frequency.   ?Musculoskeletal:  Negative for arthralgias.  ?Skin:  Negative for itching.  ?     Skin lesion  ?Neurological:  Negative for light-headedness and numbness.  ?Hematological:  Negative for adenopathy. Does not bruise/bleed easily.  ?Psychiatric/Behavioral:  Negative for confusion.   ? ?MEDICAL HISTORY:  ?Past Medical History:  ?Diagnosis Date  ? Allergy   ? Hepatitis C   ? Liver failure (HCC) 2020  ? Pneumonia 2022  ? Pre-diabetes 2021  ? ? ?SURGICAL HISTORY: ?Past Surgical History:  ?Procedure Laterality Date  ? cosmetic    ? finger  ? ? ?SOCIAL HISTORY: ?Social History  ? ?Socioeconomic History  ? Marital status: Married  ?  Spouse name: Not on file  ? Number of children: Not on file  ? Years of education: Not on file  ? Highest education level: Not on file  ?Occupational History  ?  Not on file  ?Tobacco Use  ? Smoking status: Former  ?  Types: Cigarettes  ?  Quit date: 2002  ?  Years since quitting: 21.3  ? Smokeless tobacco: Never  ?Substance and Sexual Activity  ? Alcohol use: No  ?  Alcohol/week: 0.0 standard drinks  ? Drug use: No  ? Sexual activity: Not on file  ?Other Topics Concern  ? Not on file  ?Social History Narrative  ? Not on  file  ? ?Social Determinants of Health  ? ?Financial Resource Strain: Not on file  ?Food Insecurity: Not on file  ?Transportation Needs: Not on file  ?Physical Activity: Not on file  ?Stress: Not on file  ?Social Connections: Not on file  ?Intimate Partner Violence: Not on file  ? ? ?FAMILY HISTORY: ?Family History  ?Problem Relation Age of Onset  ? Diabetes Mother   ? Tremor Mother   ? Lupus Father   ? ? ?ALLERGIES:  is allergic to acetaminophen. ? ?MEDICATIONS:  ?Current Outpatient Medications  ?Medication Sig Dispense Refill  ? aspirin 325 MG tablet Take 325 mg by mouth daily.    ? lisinopril (ZESTRIL) 10 MG tablet Take 10 mg by mouth daily.    ? rosuvastatin (CRESTOR) 20 MG tablet Take by mouth.    ? Multiple Vitamin (MULTIVITAMIN) tablet Take 1 tablet by mouth daily. (Patient not taking: Reported on 09/11/2021)    ? potassium chloride (KLOR-CON) 10 MEQ tablet Take 1 tablet (10 mEq total) by mouth 2 (two) times daily for 7 days. (Patient not taking: Reported on 03/11/2021) 14 tablet 0  ? ?No current facility-administered medications for this visit.  ? ? ? ?PHYSICAL EXAMINATION: ?ECOG PERFORMANCE STATUS: 0 - Asymptomatic ?Vitals:  ? 09/11/21 1004  ?BP: 135/89  ?Pulse: 84  ?Temp: (!) 96.7 ?F (35.9 ?C)  ? ?Filed Weights  ? 09/11/21 1004  ?Weight: 256 lb (116.1 kg)  ? ? ?Physical Exam ?Constitutional:   ?   General: He is not in acute distress. ?HENT:  ?   Head: Normocephalic and atraumatic.  ?Eyes:  ?   General: No scleral icterus. ?Cardiovascular:  ?   Rate and Rhythm: Normal rate and regular rhythm.  ?   Heart sounds: Normal heart sounds.  ?Pulmonary:  ?   Effort: Pulmonary effort is normal. No respiratory distress.  ?   Breath sounds: No wheezing.  ?Abdominal:  ?   General: Bowel sounds are normal. There is no distension.  ?   Palpations: Abdomen is soft.  ?Musculoskeletal:     ?   General: No deformity. Normal range of motion.  ?   Cervical back: Normal range of motion and neck supple.  ?Skin: ?   General: Skin  is warm and dry.  ?   Findings: No erythema or rash.  ?Neurological:  ?   Mental Status: He is alert and oriented to person, place, and time. Mental status is at baseline.  ?   Cranial Nerves: No cranial nerve deficit.  ?   Coordination: Coordination normal.  ?Psychiatric:     ?   Mood and Affect: Mood normal.  ? ? ?LABORATORY DATA:  ?I have reviewed the data as listed ?Lab Results  ?Component Value Date  ? WBC 10.6 (H) 09/09/2021  ? HGB 16.4 09/09/2021  ? HCT 48.3 09/09/2021  ? MCV 88.6 09/09/2021  ? PLT 243 09/09/2021  ? ?Recent Labs  ?  11/19/20 ?1011 11/27/20 ?1155 03/05/21 ?1140 09/09/21 ?1028  ?NA 129* 133*  --  137  ?K 3.2* 3.8  --  3.9  ?CL 94* 99  --  105  ?CO2 25 23  --  24  ?GLUCOSE 111* 154*  --  123*  ?BUN 14 32*  --  17  ?CREATININE 0.92 1.10  --  0.68  ?CALCIUM 7.9* 9.1  --  9.2  ?GFRNONAA >60 >60  --  >60  ?PROT 6.8 7.9 7.4 7.8  ?ALBUMIN 3.3* 3.8 4.4 4.1  ?AST 58* 44* 21 23  ?ALT 53* 80* 27 31  ?ALKPHOS 46 40 61 70  ?BILITOT 1.3* 1.0 0.7 0.7  ?BILIDIR  --   --  <0.1  --   ?IBILI  --   --  NOT CALCULATED  --   ? ? ?Iron/TIBC/Ferritin/ %Sat ?No results found for: IRON, TIBC, FERRITIN, IRONPCTSAT  ? ? ?RADIOGRAPHIC STUDIES: ?I have personally reviewed the radiological images as listed and agreed with the findings in the report. ?No results found. ? ? ? ?ASSESSMENT & PLAN:  ?1. Other elevated white blood cell (WBC) count   ?2. Skin rash   ? ?# Leukocytosis  ?Previous work-up were negative except that peripheral blood flow cytometry showed increased, delta T cells.  Patient also has positive T-cell receptor reengagement.  This could be secondary to infectious/inflammation versus neoplasm. ?09/09/2021, repeat flow cytometry showed similar findings.  Patient has no constitutional symptoms.  No intervention needed. ? ?#Skin lesion, ?Skin rash on his left cheek for months, also another lesion on his upper extremity.  Recommend patient follow-up with dermatology for skin biopsy. ?. ?Patient to continue  follow-up with primary care provider.  I do not think that he needs to follow-up with hematology routinely.  He can call the office to establish care in the future if clinically indicated. ?All questions were answe

## 2022-03-11 IMAGING — CT CT HEAD W/O CM
3 series · 16 of 47 positions shown, 19 images · non-contrast
Comparison: None.

CLINICAL DATA: Dizziness.  Progressive sinus infection.

EXAM:
CT HEAD WITHOUT CONTRAST
TECHNIQUE: Contiguous axial images were obtained from the base of the skull
through the vertex without intravenous contrast.

[Series 2: head wo · axial · 0.47mm/px · z∈[-84,+61]mm · 10 of 35 slices shown, 13 images]
[im 3/35  brain]
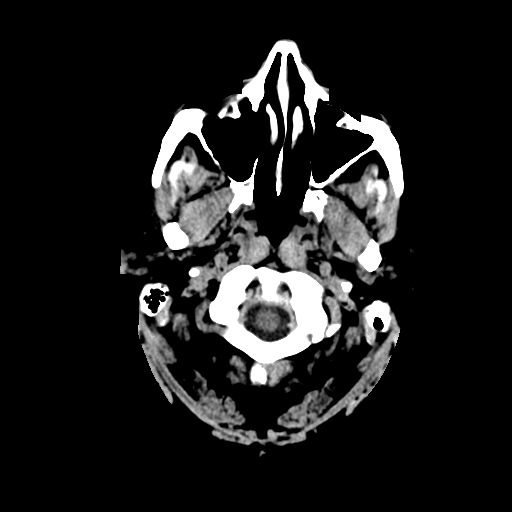
[im 3/35  bone]
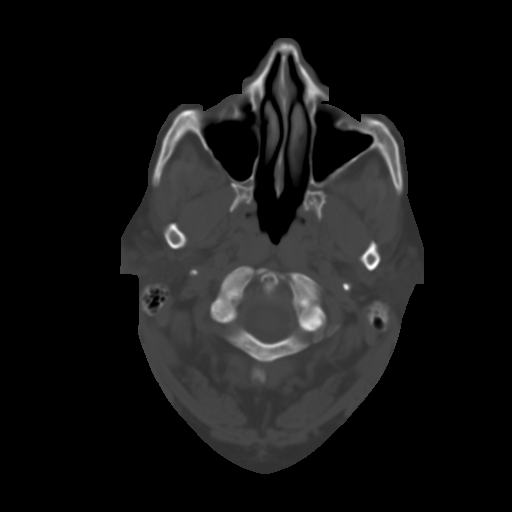
[im 6/35  brain]
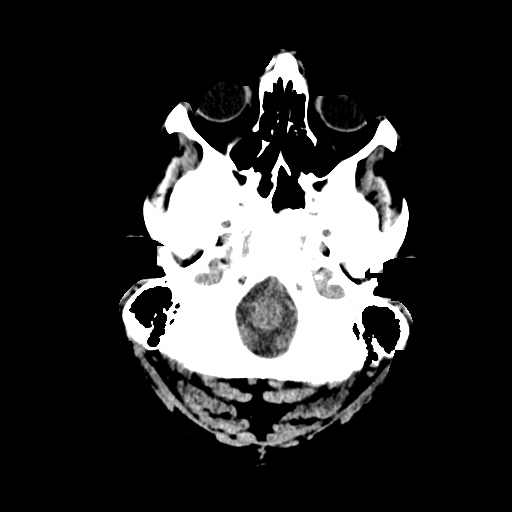
[im 10/35  brain]
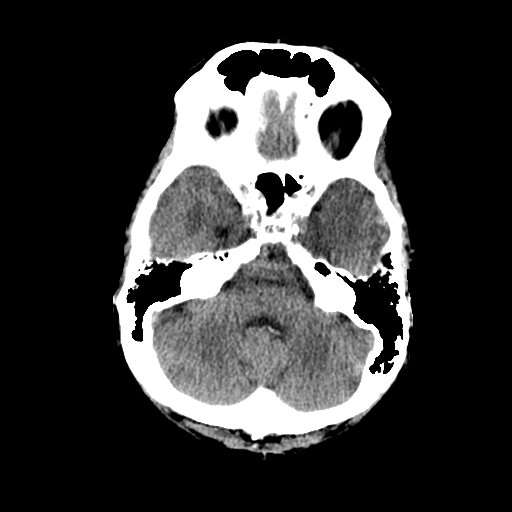
[im 12/35  brain]
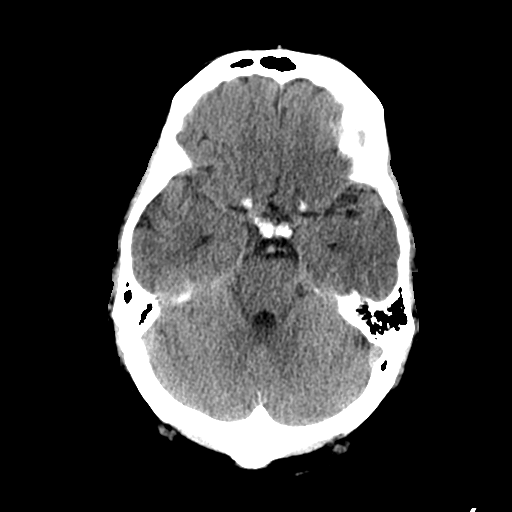
[im 16/35  brain]
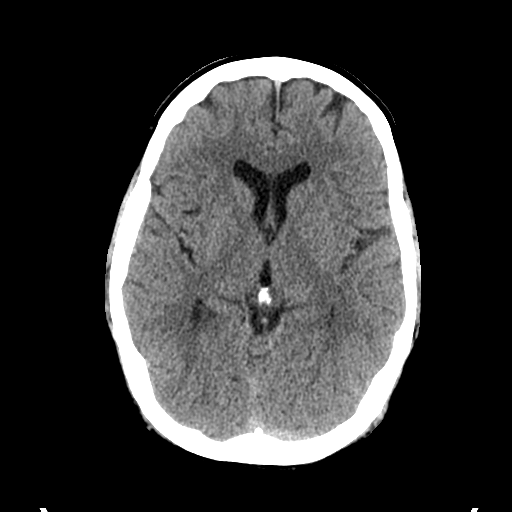
[im 16/35  bone]
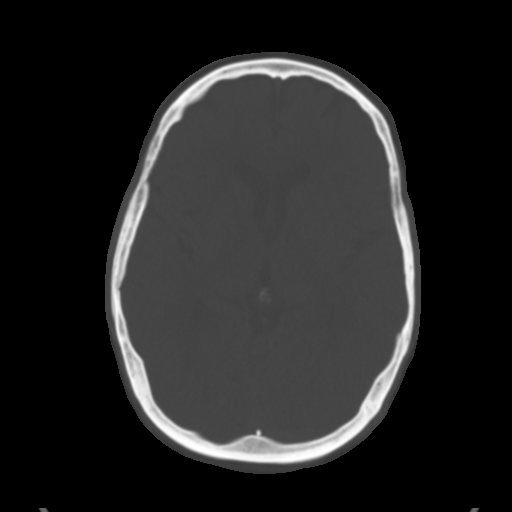
[im 19/35  brain]
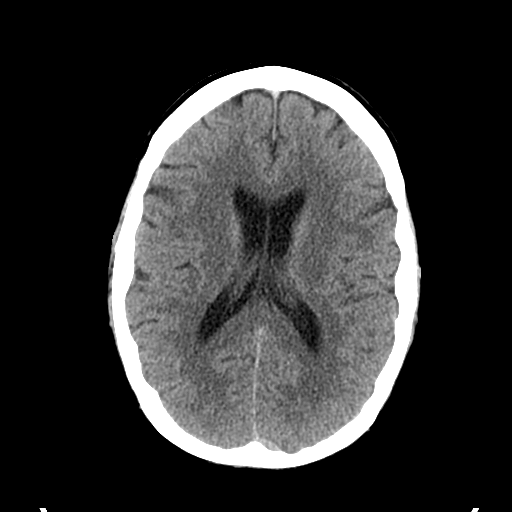
[im 23/35  brain]
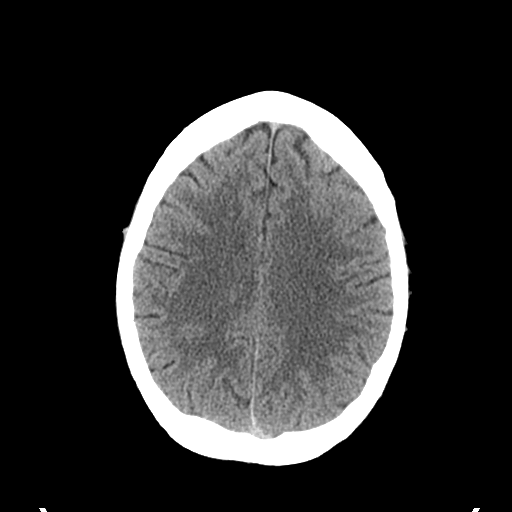
[im 26/35  brain]
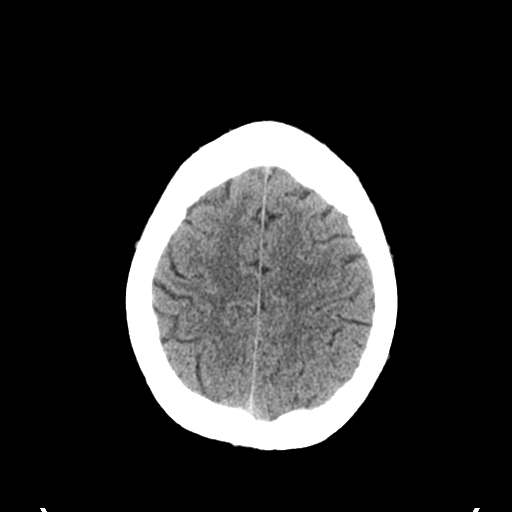
[im 29/35  brain]
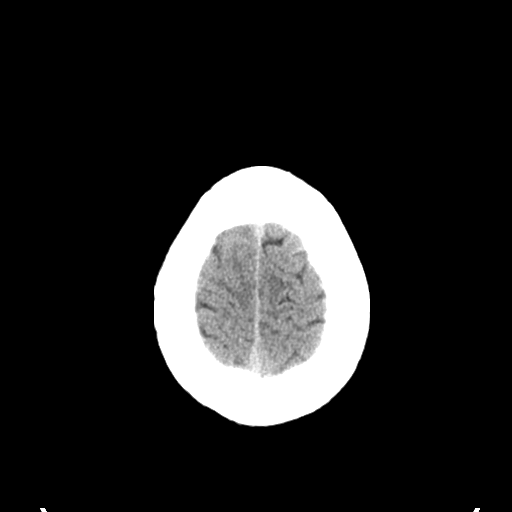
[im 29/35  bone]
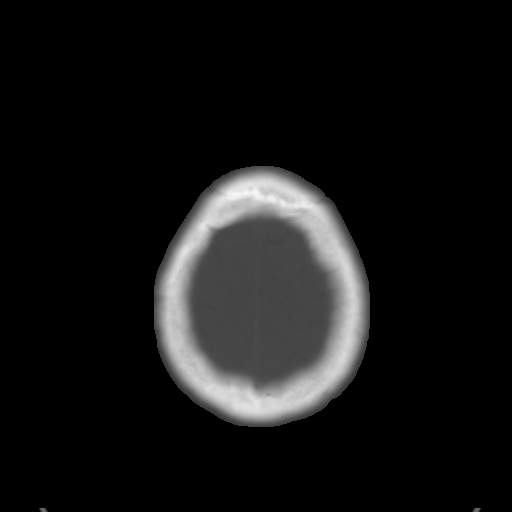
[im 32/35  brain]
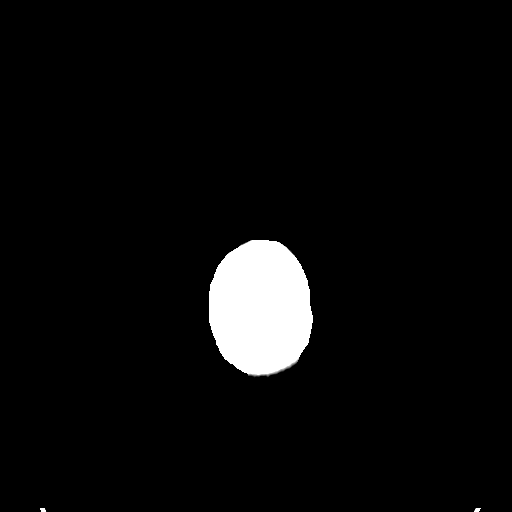

[Series 4: coronal soft tissue · coronal · 0.35mm/px · 3 of 77 slices shown]
[im 26/77  brain]
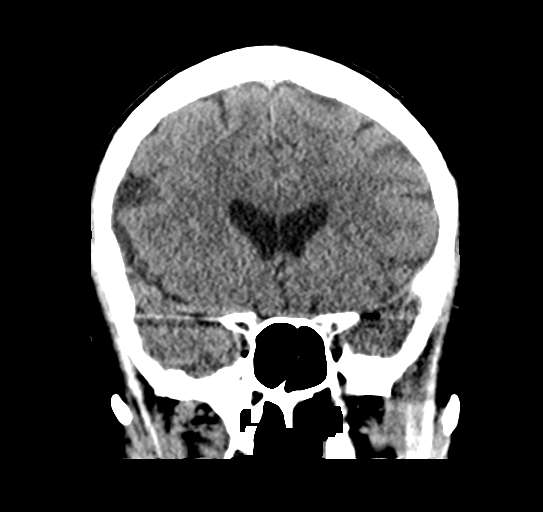
[im 34/77  brain]
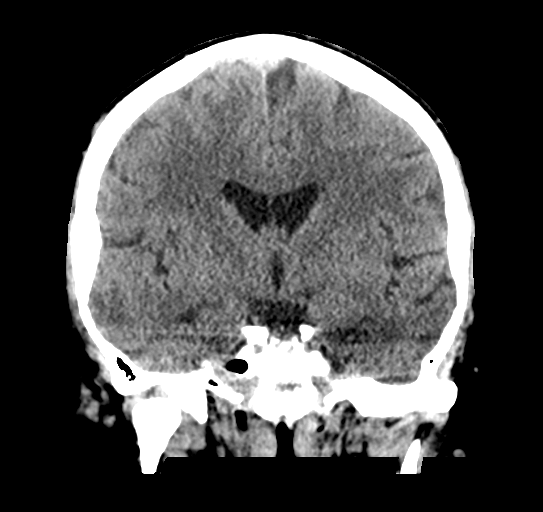
[im 43/77  brain]
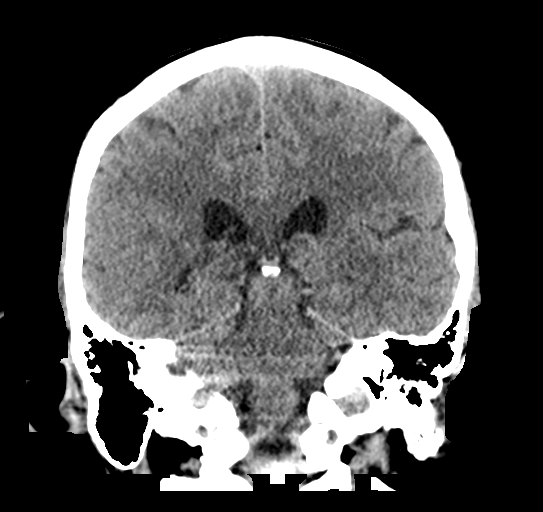

[Series 5: sagittal soft tissue · sagittal · 0.34mm/px · 3 of 60 slices shown]
[im 20/60  brain]
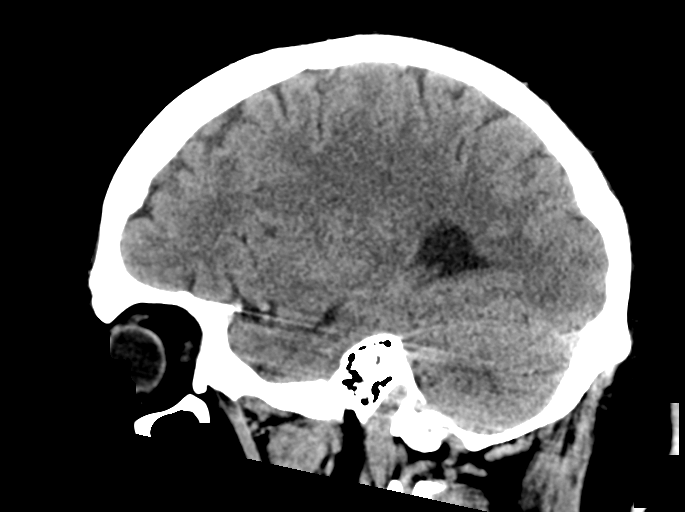
[im 30/60  brain]
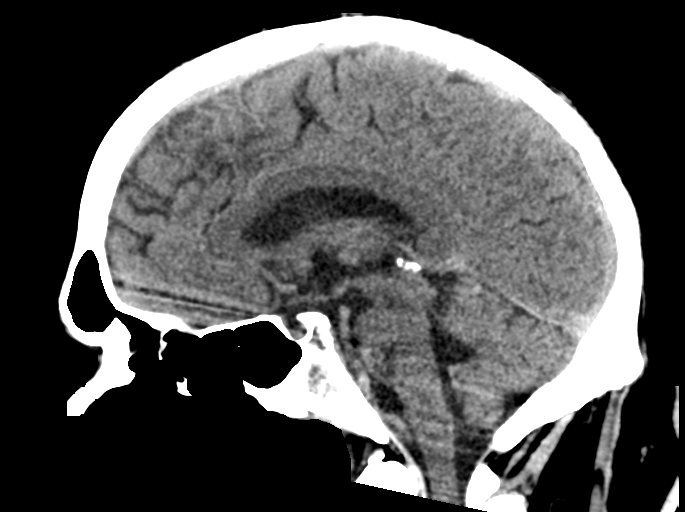
[im 40/60  brain]
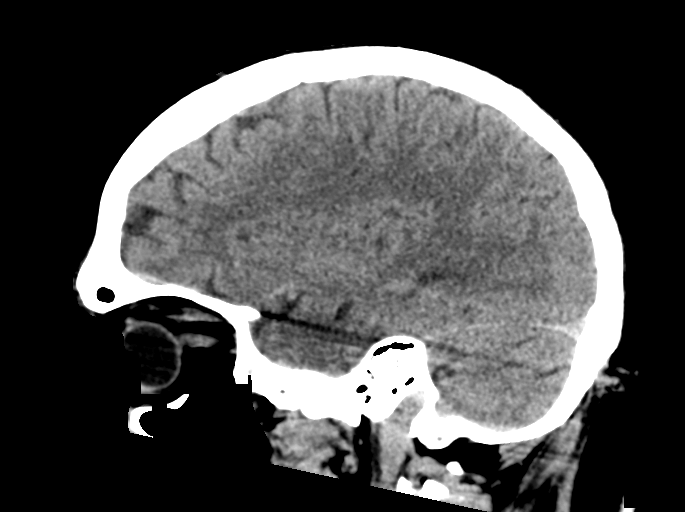

[16 of 47 positions shown; findings below may reference images not displayed]

FINDINGS: Brain: No evidence of acute infarction, hemorrhage, hydrocephalus,
extra-axial collection or mass lesion/mass effect.

Vascular: No hyperdense vessel or unexpected calcification.

Skull: Normal. Negative for fracture or focal lesion.

Sinuses/Orbits: No acute finding.

Other: None
IMPRESSION: No acute intracranial abnormalities. Normal brain.

## 2022-03-15 IMAGING — CR DG CHEST 2V
2 series · 2 of 2 positions shown · non-contrast
Comparison: July 16, 2020.

CLINICAL DATA: Fever.

EXAM:
CHEST - 2 VIEW

[chest lat]
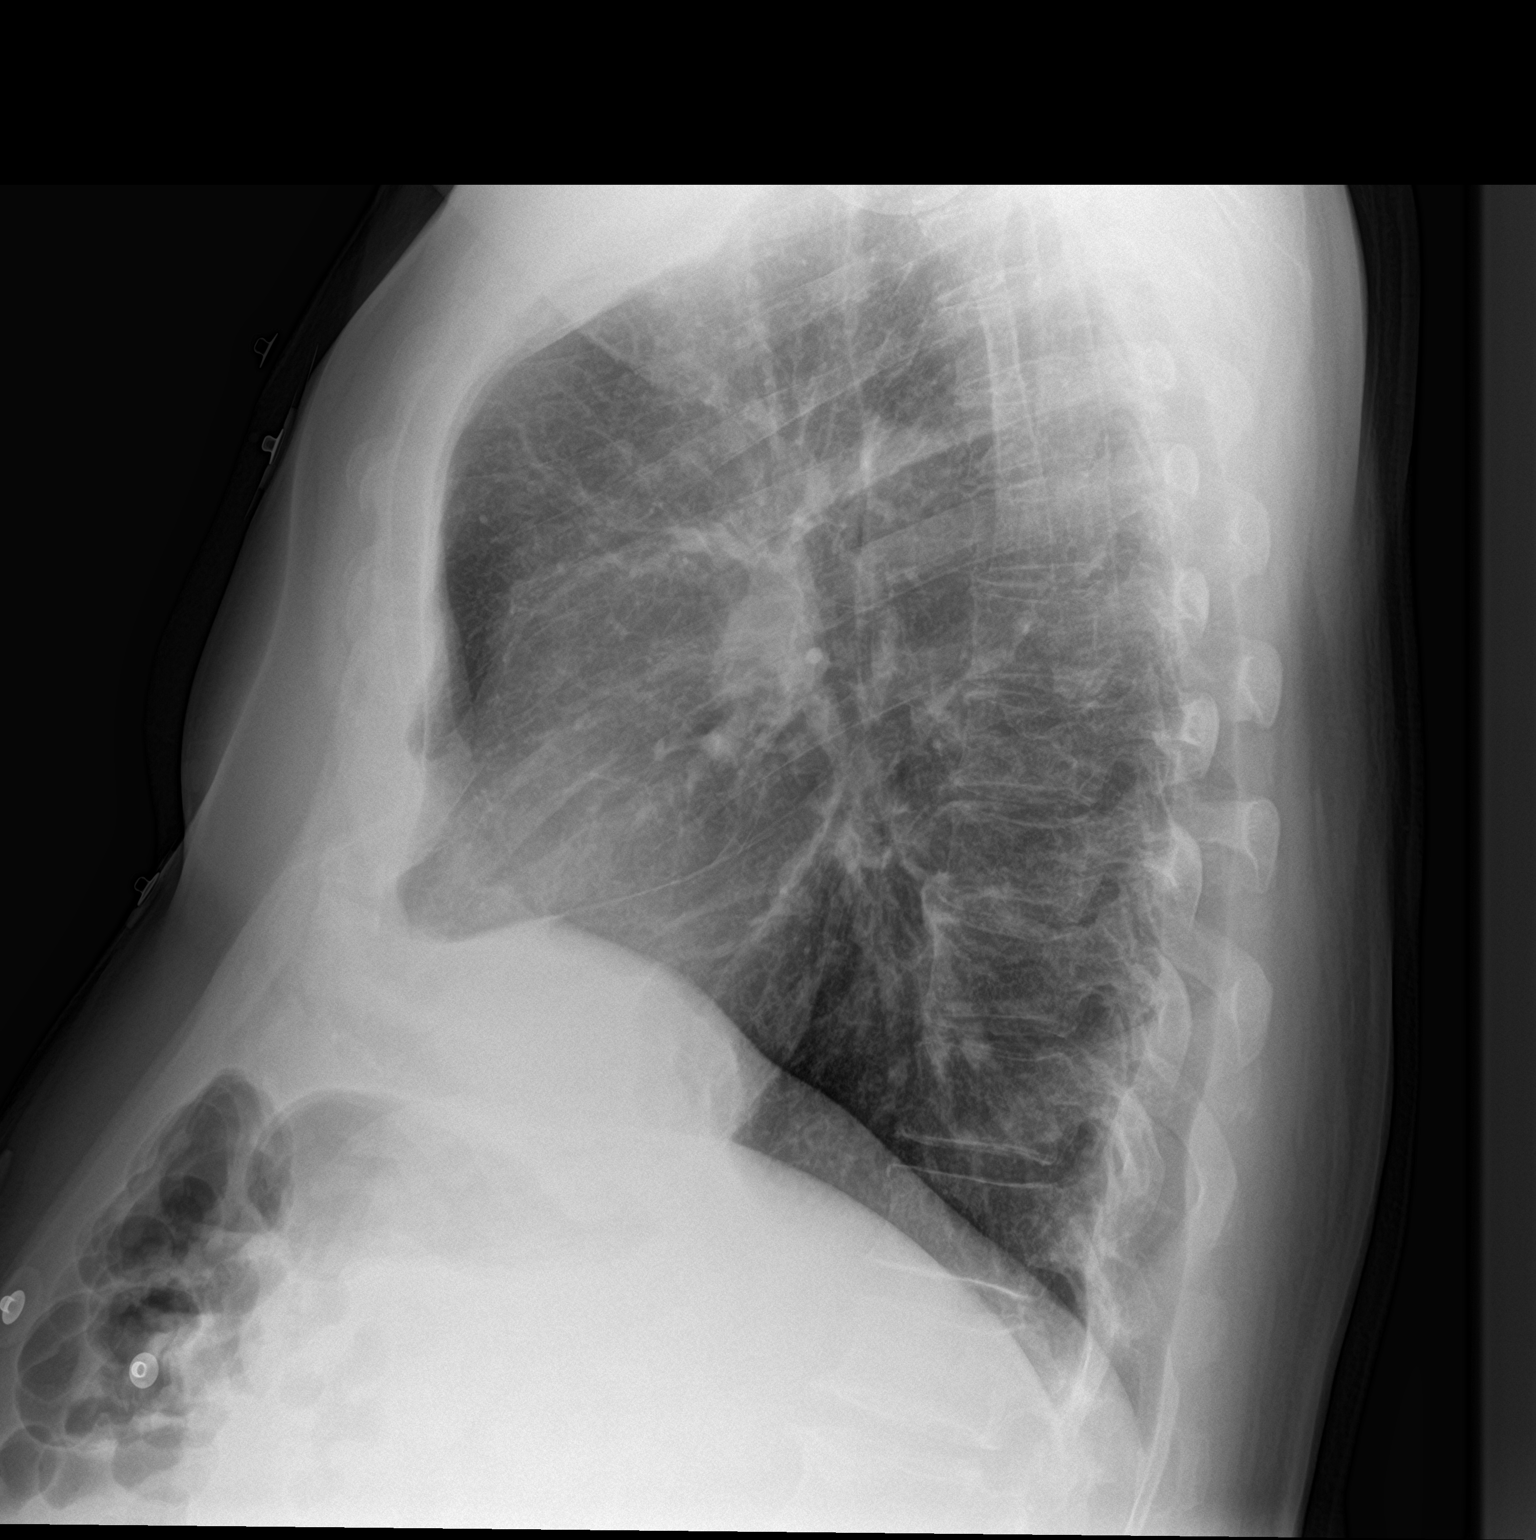

[chest pa]
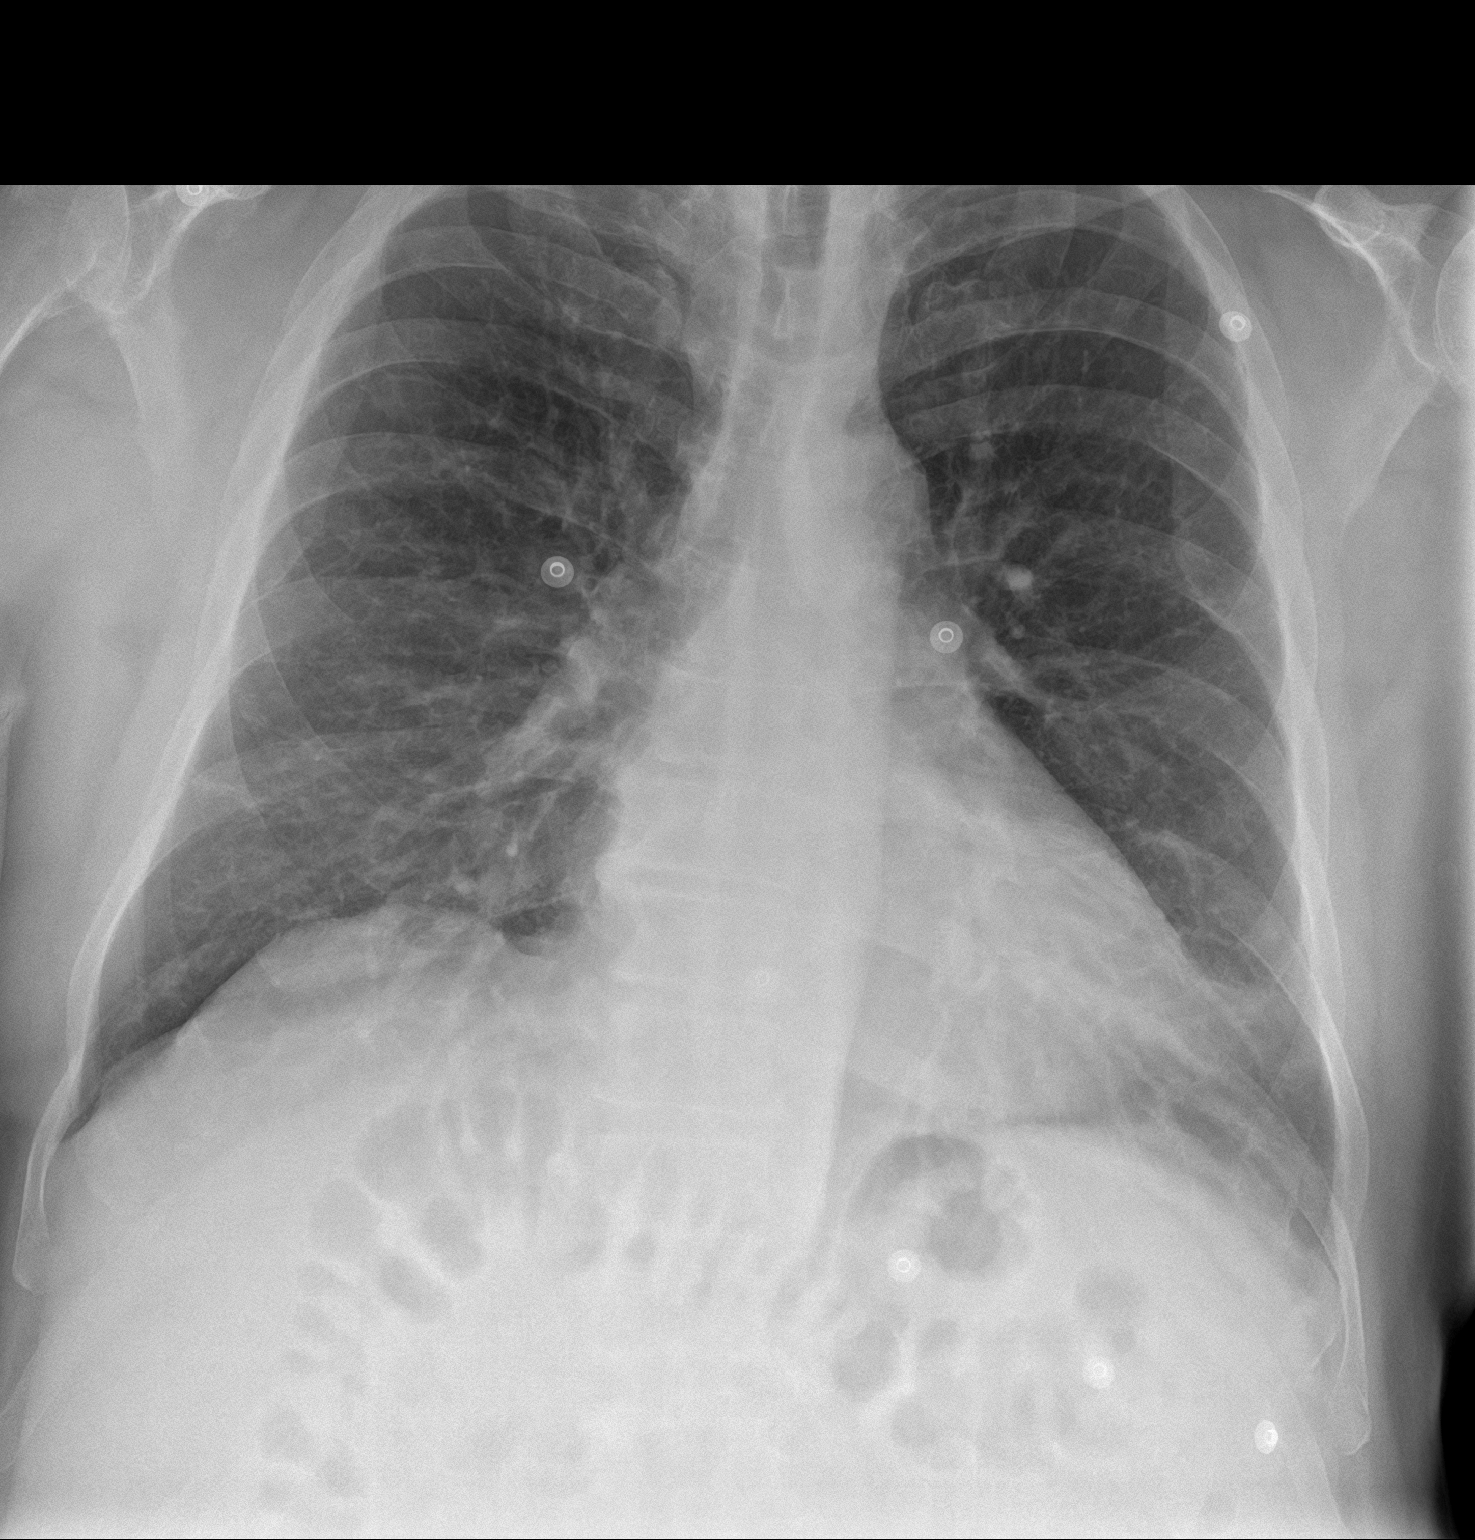

[2 of 2 positions shown; findings below may reference images not displayed]

FINDINGS: The heart size and mediastinal contours are within normal limits.
Both lungs are clear. The visualized skeletal structures are
unremarkable.
IMPRESSION: No active cardiopulmonary disease.
